# Patient Record
Sex: Female | Born: 1960 | Race: Black or African American | Hispanic: No | Marital: Single | Smoking: Never smoker
Health system: Southern US, Community
[De-identification: ages and names within clinical notes are randomized; demographics above are authoritative.]

## PROBLEM LIST (undated history)

## (undated) DIAGNOSIS — I10 Essential (primary) hypertension: Secondary | ICD-10-CM

## (undated) HISTORY — PX: TUBAL LIGATION: SHX77

## (undated) HISTORY — DX: Essential (primary) hypertension: I10

## (undated) HISTORY — PX: CHOLECYSTECTOMY: SHX55

---

## 1998-02-27 ENCOUNTER — Other Ambulatory Visit: Admission: RE | Admit: 1998-02-27 | Discharge: 1998-02-27 | Payer: Self-pay | Admitting: *Deleted

## 1999-08-05 ENCOUNTER — Other Ambulatory Visit: Admission: RE | Admit: 1999-08-05 | Discharge: 1999-08-05 | Payer: Self-pay | Admitting: *Deleted

## 2007-09-23 HISTORY — PX: CHOLECYSTECTOMY: SHX55

## 2012-07-20 ENCOUNTER — Ambulatory Visit (INDEPENDENT_AMBULATORY_CARE_PROVIDER_SITE_OTHER): Payer: BC Managed Care – PPO | Admitting: Family Medicine

## 2012-07-20 ENCOUNTER — Encounter: Payer: Self-pay | Admitting: Family Medicine

## 2012-07-20 ENCOUNTER — Ambulatory Visit: Payer: BC Managed Care – PPO

## 2012-07-20 VITALS — BP 202/104 | HR 76 | Temp 98.3°F | Resp 20 | Ht 66.0 in | Wt 241.0 lb

## 2012-07-20 DIAGNOSIS — R0789 Other chest pain: Secondary | ICD-10-CM

## 2012-07-20 DIAGNOSIS — I10 Essential (primary) hypertension: Secondary | ICD-10-CM

## 2012-07-20 DIAGNOSIS — D649 Anemia, unspecified: Secondary | ICD-10-CM

## 2012-07-20 DIAGNOSIS — R51 Headache: Secondary | ICD-10-CM

## 2012-07-20 LAB — POCT CBC
Granulocyte percent: 74.1 %G (ref 37–80)
HCT, POC: 36.7 % — AB (ref 37.7–47.9)
Hemoglobin: 10.5 g/dL — AB (ref 12.2–16.2)
Lymph, poc: 2.2 (ref 0.6–3.4)
MCH, POC: 20 pg — AB (ref 27–31.2)
MCHC: 28.6 g/dL — AB (ref 31.8–35.4)
MCV: 69.7 fL — AB (ref 80–97)
MID (cbc): 0.5 (ref 0–0.9)
MPV: 9.8 fL (ref 0–99.8)
POC Granulocyte: 7.6 — AB (ref 2–6.9)
POC LYMPH PERCENT: 21.2 %L (ref 10–50)
POC MID %: 4.7 %M (ref 0–12)
Platelet Count, POC: 432 10*3/uL — AB (ref 142–424)
RBC: 5.26 M/uL (ref 4.04–5.48)
RDW, POC: 18.8 %
WBC: 10.2 10*3/uL (ref 4.6–10.2)

## 2012-07-20 LAB — POCT URINALYSIS DIPSTICK
Bilirubin, UA: NEGATIVE
Glucose, UA: NEGATIVE
Ketones, UA: NEGATIVE
Leukocytes, UA: NEGATIVE
Nitrite, UA: NEGATIVE
Protein, UA: NEGATIVE
Spec Grav, UA: <=1.005
Urobilinogen, UA: 0.2
pH, UA: 7

## 2012-07-20 MED ORDER — LISINOPRIL-HYDROCHLOROTHIAZIDE 10-12.5 MG PO TABS: 1.0000 | ORAL_TABLET | Freq: Every day | ORAL | Status: DC

## 2012-07-20 NOTE — Progress Notes (Signed)
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   Beginning yesterday morning she developed some head fullness with blurry vision.  LMP has been irregular last two months, started last Friday and is ending.  She has a gynecologist.  G2P2.    No chest pain, leg cramps,   Objective:  NAD HEENT:  Normal including fundi Neck:  No thyroidmegaly or adenop, supple Chest:  Clear Heart:  Regular, no murmur or gallop Extrem:  No edema  [facility] reading (PRIMARY) by  Dr. Milus Glazier: CXR:  NAD EKG: nsr . Results for orders placed in visit on 07/20/12  POCT CBC      Component Value Range   WBC 10.2  4.6 - 10.2 K/uL   Lymph, poc 2.2  0.6 - 3.4   POC LYMPH PERCENT 21.2  10 - 50 %L   MID (cbc) 0.5  0 - 0.9   POC MID % 4.7  0 - 12 %M   POC Granulocyte 7.6 (*) 2 - 6.9   Granulocyte percent 74.1  37 - 80 %G   RBC 5.26  4.04 - 5.48 M/uL   Hemoglobin 10.5 (*) 12.2 - 16.2 g/dL   HCT, POC 16.1 (*) 09.6 - 47.9 %   MCV 69.7 (*) 80 - 97 fL   MCH, POC 20.0 (*) 27 - 31.2 pg   MCHC 28.6 (*) 31.8 - 35.4 g/dL   RDW, POC 04.5     Platelet Count, POC 432 (*) 142 - 424 K/uL   MPV 9.8  0 - 99.8 fL  POCT URINALYSIS DIPSTICK      Component Value Range   Color, UA yellow     Clarity, UA clear     Glucose, UA neg     Bilirubin, UA neg     Ketones, UA neg     Spec Grav, UA <=1.005     Blood, UA trace     pH, UA 7.0     Protein, UA neg     Urobilinogen, UA 0.2     Nitrite, UA neg     Leukocytes, UA Negative      Assessment: 51 year old woman with new-onset hypertension despite a 10 pound weight loss over the last several months. She demonstrates good understanding of hypertension and is motivated to lose more weight in order to correct this elevation of blood pressure.  Patient also has a anemia which is probably iron deficiency secondary to irregular vaginal bleeding. I've asked her to follow up with her gynecologist and start iron.  Plan: Followup one month.   patient to start ferrous sulfate treatment and  followup at as above 1. Hypertension  Comprehensive metabolic panel, EKG 12-Lead, DG Chest 2 View, POCT CBC, Lipid panel, POCT urinalysis dipstick, EKG 12-Lead, T4, Free, TSH, lisinopril-hydrochlorothiazide (PRINZIDE,ZESTORETIC) 10-12.5 MG per tablet  2. Headache    3. Anemia  Ferritin

## 2012-07-20 NOTE — Patient Instructions (Signed)
Iron Deficiency Anemia There are many types of anemia. Iron deficiency anemia is the most common. Iron deficiency anemia is a decrease in the number of red blood cells caused by too little iron. Without enough iron, your body does not produce enough hemoglobin. Hemoglobin is a substance in red blood cells that carries oxygen to the body's tissues. Iron deficiency anemia may leave you tired and short of breath. CAUSES   Lack of iron in the diet.  This may be seen in infants and children, because there is little iron in milk.  This may be seen in adults who do not eat enough iron-rich foods.  This may be seen in pregnant or breastfeeding women who do not take iron supplements. There is a much higher need for iron intake at these times.  Poor absorption of iron, as seen with intestinal disorders.  Intestinal bleeding.  Heavy periods. SYMPTOMS  Mild anemia may not be noticeable. Symptoms may include:  Fatigue.  Headache.  Pale skin.  Weakness.  Shortness of breath.  Dizziness.  Cold hands and feet.  Fast or irregular heartbeat. DIAGNOSIS  Diagnosis requires a thorough evaluation and physical exam by your caregiver.  Blood tests are generally used to confirm iron deficiency anemia.  Additional tests may be done to find the underlying cause of your anemia. These may include:  Testing for blood in the stool (fecal occult blood test).  A procedure to see inside the colon and rectum (colonoscopy).  A procedure to see inside the esophagus and stomach (endoscopy). TREATMENT   Correcting the cause of the iron deficiency is the first step.  Medicines, such as oral contraceptives, can make heavy menstrual flows lighter.  Antibiotics and other medicines can be used to treat peptic ulcers.  Surgery may be needed to remove a bleeding polyp, tumor, or fibroid.  Often, iron supplements (ferrous sulfate) are taken.  For the best iron absorption, take these supplements with an  empty stomach.  You may need to take the supplements with food if you cannot tolerate them on an empty stomach. Vitamin C improves the absorption of iron. Your caregiver may recommend taking your iron tablets with a glass of orange juice or vitamin C supplement.  Milk and antacids should not be taken at the same time as iron supplements. They may interfere with the absorption of iron.  Iron supplements can cause constipation. A stool softener is often recommended.  Pregnant and breastfeeding women will need to take extra iron, because their normal diet usually will not provide the required amount.  Patients who cannot tolerate iron by mouth can take it through a vein (intravenously) or by an injection into the muscle. HOME CARE INSTRUCTIONS   Ask your dietitian for help with diet questions.  Take iron and vitamins as directed by your caregiver.  Eat a diet rich in iron. Eat liver, lean beef, whole-grain bread, eggs, dried fruit, and dark green leafy vegetables. SEEK IMMEDIATE MEDICAL CARE IF:   You have a fainting episode. Do not drive yourself. Call your local emergency services (911 in [place] if no other help is available.  You have chest pain, nausea, or vomiting.  You develop severe or increased shortness of breath with activities.  You develop weakness or increased thirst.  You have a rapid heartbeat.  You develop unexplained sweating or become lightheaded when getting up from a chair or bed. MAKE SURE YOU:   Understand these instructions.  Will watch your condition.  Will get help right away   if you are not doing well or get worse. Document Released: 09/05/2000 Document Revised: 12/01/2011 Document Reviewed: 01/15/2010 [facility] Patient Information 2013 [place], [place]. Anemia, Frequently Asked Questions WHAT ARE THE SYMPTOMS OF ANEMIA?  Headache.  Difficulty thinking.  Fatigue.  Shortness of breath.  Weakness.  Rapid heartbeat. AT WHAT POINT ARE PEOPLE  CONSIDERED ANEMIC?  This varies with gender and age.   Both hemoglobin (Hgb) and hematocrit values are used to define anemia. These lab values are obtained from a complete blood count (CBC) test. This is performed at a caregiver's office.  The normal range of hemoglobin values for adult men is 14.0 g/dL to 16.1 g/dL. For nonpregnant women, values are 12.3 g/dL to 09.6 g/dL.  The World Health Organization defines anemia as less than 12 g/dL for nonpregnant women and less than 13 g/dL for men.  For adult males, the average normal hematocrit is 46%, and the range is 40% to 52%.  For adult females, the average normal hematocrit is 41%, and the range is 35% to 47%.  Values that fall below the lower limits can be a sign of anemia and should have further checking (evaluation). GROUPS OF PEOPLE WHO ARE AT RISK FOR DEVELOPING ANEMIA INCLUDE:   Infants who are breastfed or taking a formula that is not fortified with iron.  Children going through a rapid growth spurt. The iron available can not keep up with the needs for a red cell mass which must grow with the child.  Women in childbearing years. They need iron because of blood loss during menstruation.  Pregnant women. The growing fetus creates a high demand for iron.  People with ongoing gastrointestinal blood loss are at risk of developing iron deficiency.  Individuals with leukemia or cancer who must receive chemotherapy or radiation to treat their disease. The drugs or radiation used to treat these diseases often decreases the bone marrow's ability to make cells of all classes. This includes red blood cells, white blood cells, and platelets.  Individuals with chronic inflammatory conditions such as rheumatoid arthritis or chronic infections.  The elderly. ARE SOME TYPES OF ANEMIA INHERITED?   Yes, some types of anemia are due to inherited or genetic defects.  Sickle cell anemia. This occurs most often in people of African, African  American, and Mediterranean descent.  Thalassemia (or Cooley's anemia). This type is found in people of Mediterranean and Southeast Asian descent. These types of anemia are common.  Fanconi. This is rare. CAN CERTAIN MEDICATIONS CAUSE A PERSON TO BECOME ANEMIC?  Yes. For example, drugs to fight cancer (chemotherapeutic agents) often cause anemia. These drugs can slow the bone marrow's ability to make red blood cells. If there are not enough red blood cells, the body does not get enough oxygen. WHAT HEMATOCRIT LEVEL IS REQUIRED TO DONATE BLOOD?  The lower limit of an acceptable hematocrit for blood donors is 38%. If you have a low hematocrit value, you should schedule an appointment with your caregiver. ARE BLOOD TRANSFUSIONS COMMONLY USED TO CORRECT ANEMIA, AND ARE THEY DANGEROUS?  They are used to treat anemia as a last resort. Your caregiver will find the cause of the anemia and correct it if possible. Most blood transfusions are given because of excessive bleeding at the time of surgery, with trauma, or because of bone marrow suppression in patients with cancer or leukemia on chemotherapy. Blood transfusions are safer than ever before. We also know that blood transfusions affect the immune system and may increase certain risks. There  is also a concern for human error. In 1/16,000 transfusions, a patient receives a transfusion of blood that is not matched with his or her blood type.  WHAT IS IRON DEFICIENCY ANEMIA AND CAN I CORRECT IT BY CHANGING MY DIET?  Iron is an essential part of hemoglobin. Without enough hemoglobin, anemia develops and the body does not get the right amount of oxygen. Iron deficiency anemia develops after the body has had a low level of iron for a long time. This is either caused by blood loss, not taking in or absorbing enough iron, or increased demands for iron (like pregnancy or rapid growth).  Foods from animal origin such as beef, chicken, and pork, are good sources of  iron. Be sure to have one of these foods at each meal. Vitamin C helps your body absorb iron. Foods rich in Vitamin C include citrus, bell pepper, strawberries, spinach and cantaloupe. In some cases, iron supplements may be needed in order to correct the iron deficiency. In the case of poor absorption, extra iron may have to be given directly into the vein through a needle (intravenously). I HAVE BEEN DIAGNOSED WITH IRON DEFICIENCY ANEMIA AND MY CAREGIVER PRESCRIBED IRON SUPPLEMENTS. HOW LONG WILL IT TAKE FOR MY BLOOD TO BECOME NORMAL?  It depends on the degree of anemia at the beginning of treatment. Most people with mild to moderate iron deficiency, anemia will correct the anemia over a period of 2 to 3 months. But after the anemia is corrected, the iron stored by the body is still low. Caregivers often suggest an additional 6 months of oral iron therapy once the anemia has been reversed. This will help prevent the iron deficiency anemia from quickly happening again. Non-anemic adult males should take iron supplements only under the direction of a doctor, too much iron can cause liver damage.  MY HEMOGLOBIN IS 9 G/DL AND I AM SCHEDULED FOR SURGERY. SHOULD I POSTPONE THE SURGERY?  If you have Hgb of 9, you should discuss this with your caregiver right away. Many patients with similar hemoglobin levels have had surgery without problems. If minimal blood loss is expected for a minor procedure, no treatment may be necessary.  If a greater blood loss is expected for more extensive procedures, you should ask your caregiver about being treated with erythropoietin and iron. This is to accelerate the recovery of your hemoglobin to a normal level before surgery. An anemic patient who undergoes high-blood-loss surgery has a greater risk of surgical complications and need for a blood transfusion, which also carries some risk.  I HAVE BEEN TOLD THAT HEAVY MENSTRUAL PERIODS CAUSE ANEMIA. IS THERE ANYTHING I CAN DO TO  PREVENT THE ANEMIA?  Anemia that results from heavy periods is usually due to iron deficiency. You can try to meet the increased demands for iron caused by the heavy monthly blood loss by increasing the intake of iron-rich foods. Iron supplements may be required. Discuss your concerns with your caregiver. WHAT CAUSES ANEMIA DURING PREGNANCY?  Pregnancy places major demands on the body. The mother must meet the needs of both her body and her growing baby. The body needs enough iron and folate to make the right amount of red blood cells. To prevent anemia while pregnant, the mother should stay in close contact with her caregiver.  Be sure to eat a diet that has foods rich in iron and folate like liver and dark green leafy vegetables. Folate plays an important role in the normal development of a  baby's spinal cord. Folate can help prevent serious disorders like spina bifida. If your diet does not provide adequate nutrients, you may want to talk with your caregiver about nutritional supplements.  WHAT IS THE RELATIONSHIP BETWEEN FIBROID TUMORS AND ANEMIA IN WOMEN?  The relationship is usually caused by the increased menstrual blood loss caused by fibroids. Good iron intake may be required to prevent iron deficiency anemia from developing.  Document Released: 04/16/2004 Document Revised: 12/01/2011 Document Reviewed: 10/01/2010 [facility] Patient Information 2013 [place], [place]. Hypertension As your heart beats, it forces blood through your arteries. This force is your blood pressure. If the pressure is too high, it is called hypertension (HTN) or high blood pressure. HTN is dangerous because you may have it and not know it. High blood pressure may mean that your heart has to work harder to pump blood. Your arteries may be narrow or stiff. The extra work puts you at risk for heart disease, stroke, and other problems.  Blood pressure consists of two numbers, a higher number over a lower, 110/72, for example. It  is stated as "110 over 72." The ideal is below 120 for the top number (systolic) and under 80 for the bottom (diastolic). Write down your blood pressure today. You should pay close attention to your blood pressure if you have certain conditions such as:  Heart failure.  Prior heart attack.  Diabetes  Chronic kidney disease.  Prior stroke.  Multiple risk factors for heart disease. To see if you have HTN, your blood pressure should be measured while you are seated with your arm held at the level of the heart. It should be measured at least twice. A one-time elevated blood pressure reading (especially in the Emergency Department) does not mean that you need treatment. There may be conditions in which the blood pressure is different between your right and left arms. It is important to see your caregiver soon for a recheck. Most people have essential hypertension which means that there is not a specific cause. This type of high blood pressure may be lowered by changing lifestyle factors such as:  Stress.  Smoking.  Lack of exercise.  Excessive weight.  Drug/tobacco/alcohol use.  Eating less salt. Most people do not have symptoms from high blood pressure until it has caused damage to the body. Effective treatment can often prevent, delay or reduce that damage. TREATMENT  When a cause has been identified, treatment for high blood pressure is directed at the cause. There are a large number of medications to treat HTN. These fall into several categories, and your caregiver will help you select the medicines that are best for you. Medications may have side effects. You should review side effects with your caregiver. If your blood pressure stays high after you have made lifestyle changes or started on medicines,   Your medication(s) may need to be changed.  Other problems may need to be addressed.  Be certain you understand your prescriptions, and know how and when to take your  medicine.  Be sure to follow up with your caregiver within the time frame advised (usually within two weeks) to have your blood pressure rechecked and to review your medications.  If you are taking more than one medicine to lower your blood pressure, make sure you know how and at what times they should be taken. Taking two medicines at the same time can result in blood pressure that is too low. SEEK IMMEDIATE MEDICAL CARE IF:  You develop a severe headache,  blurred or changing vision, or confusion.  You have unusual weakness or numbness, or a faint feeling.  You have severe chest or abdominal pain, vomiting, or breathing problems. MAKE SURE YOU:   Understand these instructions.  Will watch your condition.  Will get help right away if you are not doing well or get worse. Document Released: 09/08/2005 Document Revised: 12/01/2011 Document Reviewed: 04/28/2008 [facility] Patient Information 2013 [place], [place].

## 2012-07-21 LAB — T4, FREE: Free T4: 1.2 ng/dL (ref 0.80–1.80)

## 2012-07-21 LAB — LIPID PANEL
Cholesterol: 171 mg/dL (ref 0–200)
HDL: 49 mg/dL (ref >39)
LDL Cholesterol: 109 mg/dL — ABNORMAL HIGH (ref 0–99)
Total CHOL/HDL Ratio: 3.5 Ratio
Triglycerides: 64 mg/dL (ref <150)
VLDL: 13 mg/dL (ref 0–40)

## 2012-07-21 LAB — COMPREHENSIVE METABOLIC PANEL
ALT: 11 U/L (ref 0–35)
AST: 17 U/L (ref 0–37)
Albumin: 4.1 g/dL (ref 3.5–5.2)
Alkaline Phosphatase: 84 U/L (ref 39–117)
BUN: 8 mg/dL (ref 6–23)
CO2: 27 mEq/L (ref 19–32)
Calcium: 9.4 mg/dL (ref 8.4–10.5)
Chloride: 102 mEq/L (ref 96–112)
Creat: 0.75 mg/dL (ref 0.50–1.10)
Glucose, Bld: 78 mg/dL (ref 70–99)
Potassium: 4.2 mEq/L (ref 3.5–5.3)
Sodium: 137 mEq/L (ref 135–145)
Total Bilirubin: 0.9 mg/dL (ref 0.3–1.2)
Total Protein: 7.4 g/dL (ref 6.0–8.3)

## 2012-07-21 LAB — TSH: TSH: 1.409 u[IU]/mL (ref 0.350–4.500)

## 2012-07-21 LAB — FERRITIN: Ferritin: 8 ng/mL — ABNORMAL LOW (ref 10–291)

## 2012-07-22 ENCOUNTER — Telehealth: Payer: Self-pay

## 2012-07-22 NOTE — Telephone Encounter (Signed)
Pt is calling returning our call regarding labs

## 2012-07-22 NOTE — Telephone Encounter (Signed)
Patient has been advised of lab results.  °

## 2013-07-20 ENCOUNTER — Encounter: Payer: Self-pay | Admitting: Family Medicine

## 2013-07-20 ENCOUNTER — Ambulatory Visit (INDEPENDENT_AMBULATORY_CARE_PROVIDER_SITE_OTHER): Payer: BC Managed Care – PPO | Admitting: Family Medicine

## 2013-07-20 VITALS — BP 178/94 | HR 80 | Temp 98.4°F | Resp 16 | Ht 65.25 in | Wt 216.8 lb

## 2013-07-20 DIAGNOSIS — Z0000 Encounter for general adult medical examination without abnormal findings: Secondary | ICD-10-CM

## 2013-07-20 DIAGNOSIS — N951 Menopausal and female climacteric states: Secondary | ICD-10-CM

## 2013-07-20 DIAGNOSIS — E041 Nontoxic single thyroid nodule: Secondary | ICD-10-CM

## 2013-07-20 DIAGNOSIS — Z23 Encounter for immunization: Secondary | ICD-10-CM

## 2013-07-20 DIAGNOSIS — D649 Anemia, unspecified: Secondary | ICD-10-CM

## 2013-07-20 DIAGNOSIS — I10 Essential (primary) hypertension: Secondary | ICD-10-CM

## 2013-07-20 LAB — COMPREHENSIVE METABOLIC PANEL
ALT: 10 U/L (ref 0–35)
AST: 15 U/L (ref 0–37)
Albumin: 4.4 g/dL (ref 3.5–5.2)
Alkaline Phosphatase: 63 U/L (ref 39–117)
BUN: 10 mg/dL (ref 6–23)
CO2: 27 mEq/L (ref 19–32)
Calcium: 9.6 mg/dL (ref 8.4–10.5)
Chloride: 101 mEq/L (ref 96–112)
Creat: 0.63 mg/dL (ref 0.50–1.10)
Glucose, Bld: 81 mg/dL (ref 70–99)
Potassium: 4.3 mEq/L (ref 3.5–5.3)
Sodium: 138 mEq/L (ref 135–145)
Total Bilirubin: 1 mg/dL (ref 0.3–1.2)
Total Protein: 7.5 g/dL (ref 6.0–8.3)

## 2013-07-20 LAB — CBC
HCT: 37.5 % (ref 36.0–46.0)
Hemoglobin: 11.8 g/dL — ABNORMAL LOW (ref 12.0–15.0)
MCH: 24 pg — ABNORMAL LOW (ref 26.0–34.0)
MCHC: 31.5 g/dL (ref 30.0–36.0)
MCV: 76.4 fL — ABNORMAL LOW (ref 78.0–100.0)
Platelets: 348 10*3/uL (ref 150–400)
RBC: 4.91 MIL/uL (ref 3.87–5.11)
RDW: 15.2 % (ref 11.5–15.5)
WBC: 8.8 10*3/uL (ref 4.0–10.5)

## 2013-07-20 LAB — TSH: TSH: 1.552 u[IU]/mL (ref 0.350–4.500)

## 2013-07-20 MED ORDER — LISINOPRIL-HYDROCHLOROTHIAZIDE 10-12.5 MG PO TABS: 1.0000 | ORAL_TABLET | Freq: Every day | ORAL | Status: DC

## 2013-07-20 NOTE — Progress Notes (Signed)
Patient ID: Toshua Honsinger MRN: [id number], DOB: 1960/10/06, 52 y.o. Date of Encounter: 07/20/2013, 1:28 PM  Primary Physician: No primary provider on file.  Chief Complaint: HTN  HPI: 52 y.o. year old female with history below presents for hypertension follow up. 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   No past medical history on file.   Home Meds: Prior to Admission medications   Medication Sig Start Date End Date Taking? Authorizing Provider  lisinopril-hydrochlorothiazide (PRINZIDE,ZESTORETIC) 10-12.5 MG per tablet Take 1 tablet by mouth daily. 07/20/12  Yes Elvina Sidle, MD    Allergies: No Known Allergies  History   Social History  . Marital Status: Single    Spouse Name: N/A    Number of Children: N/A  . Years of Education: N/A   Occupational History  . Not on file.   Social History Main Topics  . Smoking status: Never Smoker   . Smokeless tobacco: Not on file  . Alcohol Use: Not on file  . Drug Use: Not on file  . Sexual Activity: Not on file   Other Topics Concern  . Not on file   Social History Narrative  . No narrative on file     Family History  Problem Relation Age of Onset  . Hyperlipidemia Mother   . Cancer Father     Review of Systems: Constitutional: negative for chills, fever, night sweats, weight changes, or fatigue  HEENT: negative for vision changes, hearing loss, congestion, rhinorrhea, ST, epistaxis, or sinus pressure Cardiovascular: negative for chest pain, palpitations, or DOE Respiratory: negative for hemoptysis, wheezing, shortness of breath, or cough Abdominal: negative for abdominal pain, nausea, vomiting, diarrhea, or constipation Dermatological: negative for rash Neurologic: negative for headache, dizziness, or syncope All other systems reviewed and are otherwise negative with the exception to those above and in the  HPI.   Physical Exam:  BP recheck 138/76 Blood pressure 178/94, pulse 80, temperature 98.4 F (36.9 C), temperature source Oral, resp. rate 16, height 5' 5.25" (1.657 m), weight 216 lb 12.8 oz (98.34 kg), last menstrual period 07/06/2013, SpO2 100.00%., Body mass index is 35.82 kg/(m^2). General: Well developed, well nourished, in no acute distress. Head: Normocephalic, atraumatic, eyes without discharge, sclera non-icteric, nares are without discharge. Bilateral auditory canals clear, TM's are without perforation, pearly grey and translucent with reflective cone of light bilaterally. Oral cavity moist, posterior pharynx without exudate, erythema, peritonsillar abscess, or post nasal drip.  Neck: Supple. Left thyroid nodule Full ROM. No lymphadenopathy. No carotid bruits. Lungs: Clear bilaterally to auscultation without wheezes, rales, or rhonchi. Breathing is unlabored. Heart: RRR with S1 S2. No murmurs, rubs, or gallops appreciated.  Msk:  Strength and tone normal for age. Extremities/Skin: Warm and dry. No clubbing or cyanosis. No edema. No rashes or suspicious lesions. Distal pulses 2+ and equal bilaterally. Neuro: Alert and oriented X 3. Moves all extremities spontaneously. Gait is normal. CNII-XII grossly in tact. DTR 2+, cerebellar function intact. Rhomberg normal. Psych:  Responds to questions appropriately with a normal affect.   Labs:  CMP pending  ASSESSMENT AND PLAN:  52 y.o. year old female with Hypertension - Plan: TSH, Comprehensive metabolic panel, CBC  Anemia - Plan: TSH, Comprehensive metabolic panel, CBC  Perimenopausal - Plan: CBC  Thyroid nodule - Plan: TSH, US Soft Tissue Head/Neck  Annual physical exam - Plan: Ambulatory referral to Gastroenterology   -  Signed, Kenyon Ana  Chinwe Lope, MD 07/20/2013 1:28 PM

## 2013-07-30 ENCOUNTER — Other Ambulatory Visit: Payer: Self-pay | Admitting: Family Medicine

## 2013-08-10 ENCOUNTER — Ambulatory Visit
Admission: RE | Admit: 2013-08-10 | Discharge: 2013-08-10 | Disposition: A | Discharge status: Home / Self Care | Payer: BC Managed Care – PPO | Source: Ambulatory Visit | Attending: Family Medicine | Admitting: Family Medicine

## 2013-08-10 DIAGNOSIS — E041 Nontoxic single thyroid nodule: Secondary | ICD-10-CM

## 2013-08-12 ENCOUNTER — Other Ambulatory Visit: Payer: Self-pay | Admitting: Family Medicine

## 2013-08-12 DIAGNOSIS — E041 Nontoxic single thyroid nodule: Secondary | ICD-10-CM

## 2013-08-15 ENCOUNTER — Telehealth: Payer: Self-pay

## 2013-08-15 ENCOUNTER — Other Ambulatory Visit: Payer: Self-pay | Admitting: Radiology

## 2013-08-15 DIAGNOSIS — E041 Nontoxic single thyroid nodule: Secondary | ICD-10-CM

## 2013-08-15 NOTE — Telephone Encounter (Signed)
Dr. Elbert Ewings   Patient is being asked to come back for second imaging and wants your blessing.   [phone]

## 2013-08-15 NOTE — Telephone Encounter (Signed)
Did you advise patient? I was asked to put in biopsy order, What do you want me to advise her?

## 2013-08-16 ENCOUNTER — Telehealth: Payer: Self-pay | Admitting: Radiology

## 2013-08-16 NOTE — Telephone Encounter (Signed)
Phone call to patient/ apologized for the miscommunication. She did not get a call about the biopsy, it was ordered for her. Explained, and she understands, to you [facility]

## 2013-08-16 NOTE — Telephone Encounter (Signed)
Order put in, is patient aware?

## 2013-08-16 NOTE — Telephone Encounter (Signed)
Yes, please put in additional imaging request

## 2013-08-16 NOTE — Telephone Encounter (Signed)
Patient was not aware. I have called her and advised of need for biopsy.

## 2013-08-24 ENCOUNTER — Ambulatory Visit
Admission: RE | Admit: 2013-08-24 | Discharge: 2013-08-24 | Disposition: A | Discharge status: Home / Self Care | Payer: BC Managed Care – PPO | Source: Ambulatory Visit | Attending: Family Medicine | Admitting: Family Medicine

## 2013-08-24 ENCOUNTER — Other Ambulatory Visit (HOSPITAL_COMMUNITY)
Admission: RE | Admit: 2013-08-24 | Discharge: 2013-08-24 | Disposition: A | Discharge status: Home / Self Care | Payer: BC Managed Care – PPO | Source: Ambulatory Visit | Attending: Interventional Radiology | Admitting: Interventional Radiology

## 2013-08-24 DIAGNOSIS — E041 Nontoxic single thyroid nodule: Secondary | ICD-10-CM

## 2013-09-07 ENCOUNTER — Encounter: Payer: Self-pay | Admitting: Family Medicine

## 2013-12-14 ENCOUNTER — Other Ambulatory Visit: Payer: Self-pay | Admitting: Obstetrics and Gynecology

## 2014-01-16 IMAGING — US US THYROID BIOPSY
1 series · 11 of 11 positions shown · non-contrast
Comparison: Thyroid ultrasound dated 08/10/2013

COMPLICATIONS:
None

CLINICAL DATA: Dominant 3.5 cm left thyroid nodule.

EXAM:
ULTRASOUND GUIDED NEEDLE ASPIRATE BIOPSY OF THE THYROID GLAND

[Series 1: us thyroid biopsy · 0.07mm/px · 11 acquisitions, 11 frames shown]
[im 1/11]
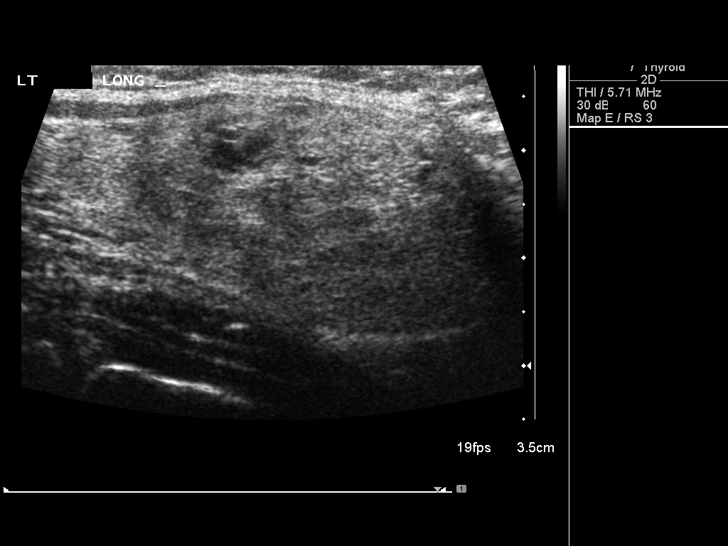
[im 2/11]
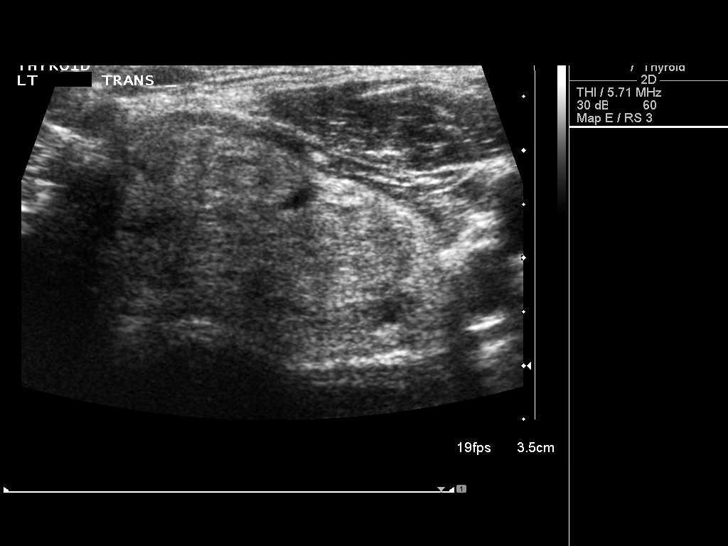
[im 3/11]
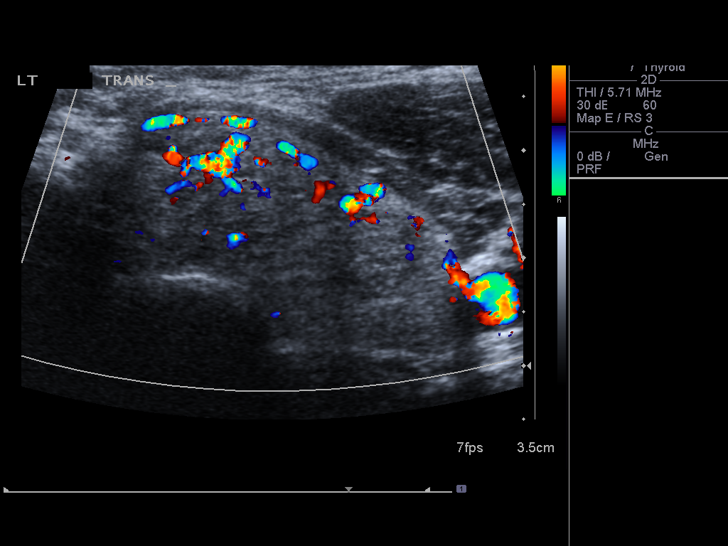
[im 4/11]
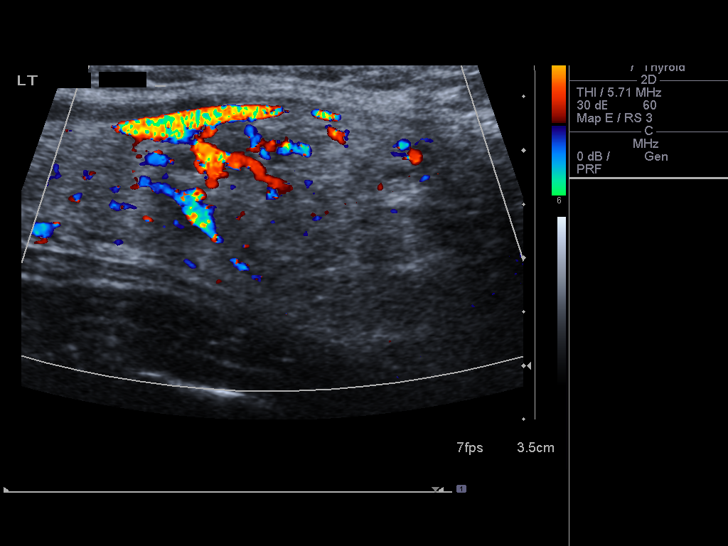
[im 5/11]
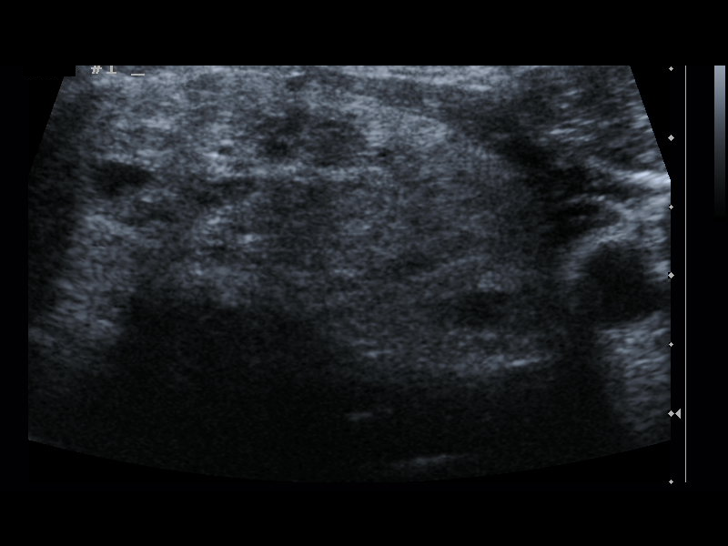
[im 6/11]
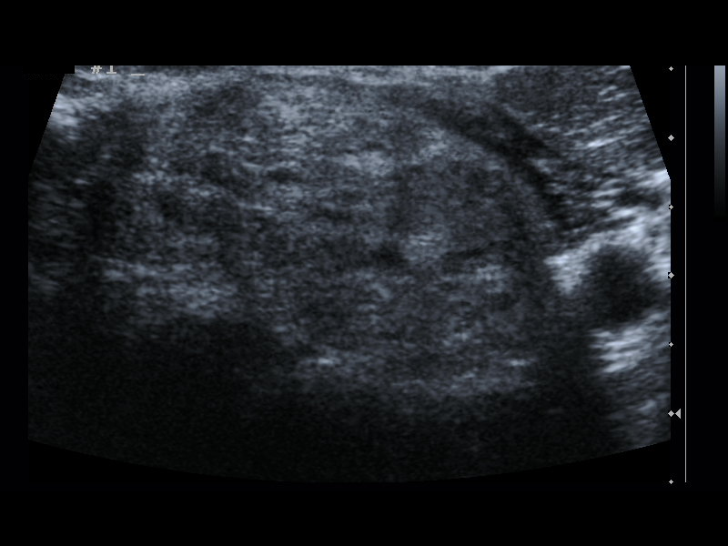
[im 7/11]
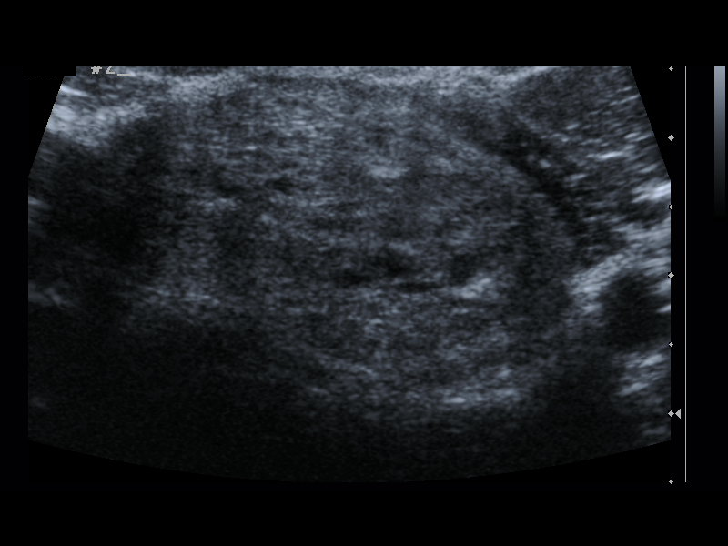
[im 8/11]
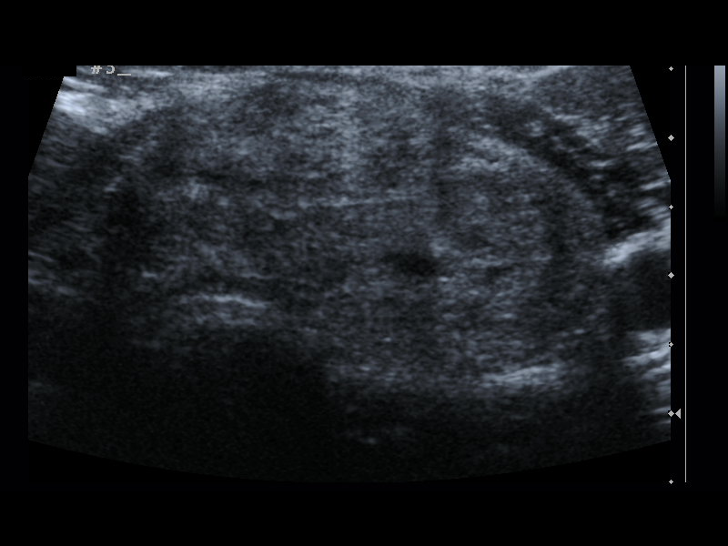
[im 9/11]
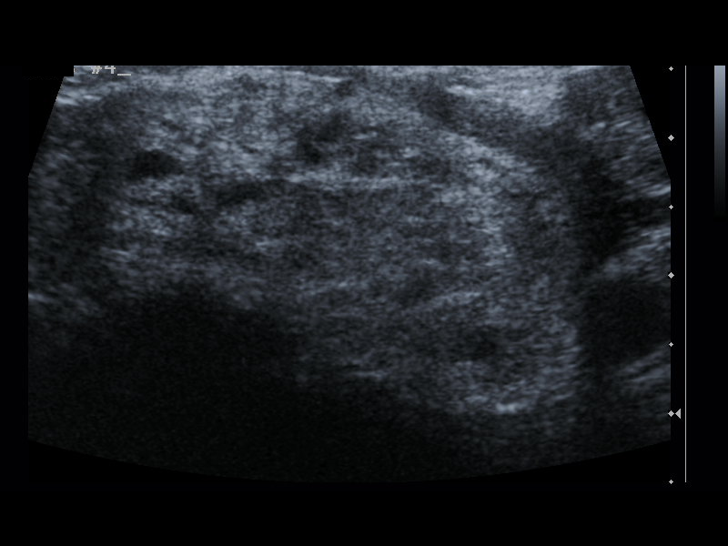
[im 10/11]
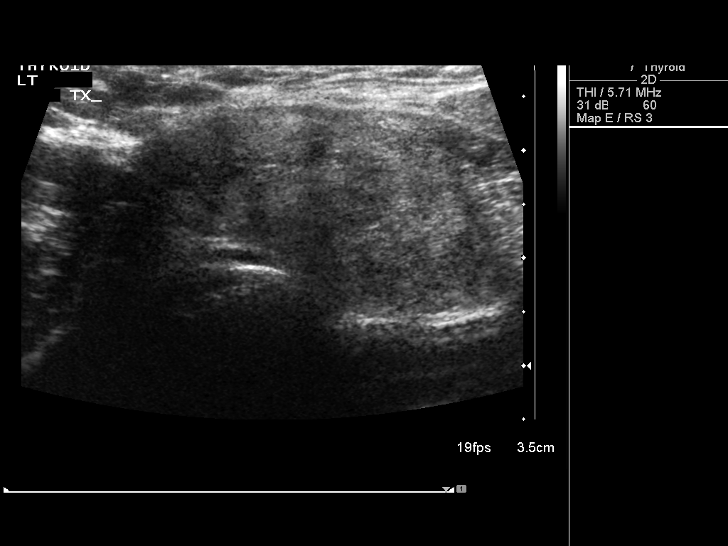
[im 11/11]
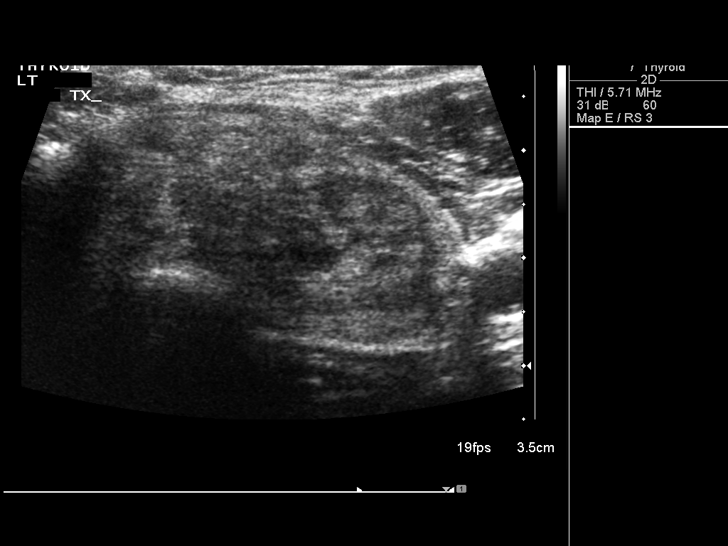

[11 of 11 positions shown; findings below may reference images not displayed]

FINDINGS: Needle aspirate samples were obtained in different portions of the
dominant left thyroid nodule.
IMPRESSION: Ultrasound guided needle aspirate biopsy performed of the dominant
left thyroid nodule.

PROCEDURE:
Thyroid biopsy was thoroughly discussed with the patient and
questions were answered. The benefits, risks, alternatives, and
complications were also discussed. The patient understands and
wishes to proceed with the procedure. Written consent was obtained.

Ultrasound was performed to localize and mark an adequate site for
the biopsy. The patient was then prepped and draped in a normal
sterile fashion. Local anesthesia was provided with 1% lidocaine.
Using direct ultrasound guidance, 4 passes were made using 25 gauge
needles into the nodule within the left lobe of the thyroid.
Ultrasound was used to confirm needle placements on all occasions.
Specimens were sent to Pathology for analysis.

## 2014-08-07 ENCOUNTER — Ambulatory Visit (INDEPENDENT_AMBULATORY_CARE_PROVIDER_SITE_OTHER): Payer: BC Managed Care – PPO | Admitting: Family Medicine

## 2014-08-07 ENCOUNTER — Encounter: Payer: Self-pay | Admitting: Family Medicine

## 2014-08-07 VITALS — BP 150/80 | HR 67 | Temp 98.3°F | Resp 16 | Ht 66.0 in | Wt 227.0 lb

## 2014-08-07 DIAGNOSIS — I10 Essential (primary) hypertension: Secondary | ICD-10-CM

## 2014-08-07 DIAGNOSIS — Z1211 Encounter for screening for malignant neoplasm of colon: Secondary | ICD-10-CM

## 2014-08-07 LAB — COMPREHENSIVE METABOLIC PANEL
ALT: 14 U/L (ref 0–35)
AST: 18 U/L (ref 0–37)
Albumin: 4.2 g/dL (ref 3.5–5.2)
Alkaline Phosphatase: 76 U/L (ref 39–117)
BUN: 10 mg/dL (ref 6–23)
CO2: 30 mEq/L (ref 19–32)
Calcium: 9.4 mg/dL (ref 8.4–10.5)
Chloride: 99 mEq/L (ref 96–112)
Creat: 0.7 mg/dL (ref 0.50–1.10)
Glucose, Bld: 80 mg/dL (ref 70–99)
Potassium: 3.9 mEq/L (ref 3.5–5.3)
Sodium: 140 mEq/L (ref 135–145)
Total Bilirubin: 0.8 mg/dL (ref 0.2–1.2)
Total Protein: 7.8 g/dL (ref 6.0–8.3)

## 2014-08-07 MED ORDER — LISINOPRIL-HYDROCHLOROTHIAZIDE 10-12.5 MG PO TABS: 1.0000 | ORAL_TABLET | Freq: Every day | ORAL | Status: DC

## 2014-08-07 NOTE — Progress Notes (Signed)
° °  Subjective:    Patient ID: Kathy Barron, female    DOB: 11/30/1960, 53 y.o.   MRN: [id number] This chart was scribed for Robyn Haber, MD by Marti Sleigh, Medical Scribe. This patient was seen in Room 27 and the patient's care was started a 10:46 AM.   HPI HPI Comments: Kathy Barron is a 53 y.o. female who presents to [facility] reporting for a medication refill. Pt states she needs a referral for colonoscopy. Pt states she needs a refill of her HCTZ. Pt states she will return for a flu shot once her rhinorrhea resolves.    Review of Systems  Constitutional: Negative for fever and chills.  HENT: Positive for rhinorrhea.   Respiratory: Negative for cough.   Musculoskeletal: Negative for myalgias.  Neurological: Negative for headaches.       Objective:   Physical Exam  Constitutional: She is oriented to person, place, and time. She appears well-developed and well-nourished.  HENT:  Head: Normocephalic and atraumatic.  Eyes: EOM are normal. Pupils are equal, round, and reactive to light. Right eye exhibits no discharge. Left eye exhibits no discharge.  Neck: Neck supple.  Cardiovascular: Normal rate and regular rhythm.   Pulmonary/Chest: Effort normal and breath sounds normal. No respiratory distress.  Neurological: She is alert and oriented to person, place, and time.  Skin: Skin is warm and dry.  Psychiatric: She has a normal mood and affect. Her behavior is normal.  Nursing note and vitals reviewed.      Assessment & Plan:    Essential hypertension - Plan: Comprehensive metabolic panel  Special screening for malignant neoplasms, colon - Plan: Ambulatory referral to Gastroenterology  Signed, Robyn Haber, MD

## 2014-08-12 ENCOUNTER — Other Ambulatory Visit: Payer: Self-pay | Admitting: Family Medicine

## 2015-04-02 ENCOUNTER — Ambulatory Visit (INDEPENDENT_AMBULATORY_CARE_PROVIDER_SITE_OTHER): Payer: BLUE CROSS/BLUE SHIELD | Admitting: Family Medicine

## 2015-04-02 VITALS — BP 126/82 | HR 63 | Temp 98.0°F | Resp 18 | Ht 66.0 in | Wt 231.8 lb

## 2015-04-02 DIAGNOSIS — S90861A Insect bite (nonvenomous), right foot, initial encounter: Secondary | ICD-10-CM

## 2015-04-02 DIAGNOSIS — S80862A Insect bite (nonvenomous), left lower leg, initial encounter: Secondary | ICD-10-CM

## 2015-04-02 DIAGNOSIS — L299 Pruritus, unspecified: Secondary | ICD-10-CM

## 2015-04-02 DIAGNOSIS — S90862A Insect bite (nonvenomous), left foot, initial encounter: Secondary | ICD-10-CM

## 2015-04-02 DIAGNOSIS — W57XXXA Bitten or stung by nonvenomous insect and other nonvenomous arthropods, initial encounter: Secondary | ICD-10-CM | POA: Diagnosis not present

## 2015-04-02 DIAGNOSIS — S80861A Insect bite (nonvenomous), right lower leg, initial encounter: Secondary | ICD-10-CM

## 2015-04-02 MED ORDER — TRIAMCINOLONE ACETONIDE 0.1 % EX CREA: 1.0000 "application " | TOPICAL_CREAM | Freq: Two times a day (BID) | CUTANEOUS | Status: DC

## 2015-04-02 MED ORDER — METHYLPREDNISOLONE ACETATE 80 MG/ML IJ SUSP
80.0000 mg | Freq: Once | INTRAMUSCULAR | Status: AC
  Administered 2015-04-02: 80 mg via INTRAMUSCULAR

## 2015-04-02 NOTE — Progress Notes (Signed)
  Subjective:  Patient ID: Kathy Barron, female    DOB: August 29, 1961  Age: 54 y.o. MRN: [id number]  54 year old who stepped on and he'll last week. She was down in [place] for [place], and they were standing outside watching fireworks when she got bit on her feet and ankle. She put some cortisone cream on these places, and took a Benadryl. They seem to calm down initially, but then they got to where they're itching more. The itch a lot at nighttime which stress go to bed under the sheets they seem to itch more. She is not gotten them to go away so she came on in here. She is not diabetic.   Objective:   Pleasant alert lady with multiple insect bites primarily on the dorsum of both feet and the few on the ankle. None of the places have been badly excoriated and none look infected  Assessment & Plan:   Assessment: Multiple insect bites/ant stings Itching  Plan:  Depo-Medrol 80 IM Topical hydrocortisone Cetirizine Patient Instructions  Use the triamcinolone cream or 3 times daily on the bites  Take over-the-counter Zyrtec (cetirizine) once or twice daily for allergy and itching  You have received an injection of Depo-Medrol 80(a long-acting cortisone injection)  Return if any concern of infection     Dajaun Goldring, MD 04/02/2015

## 2015-04-02 NOTE — Patient Instructions (Signed)
Use the triamcinolone cream or 3 times daily on the bites  Take over-the-counter Zyrtec (cetirizine) once or twice daily for allergy and itching  You have received an injection of Depo-Medrol 80(a long-acting cortisone injection)  Return if any concern of infection

## 2015-07-03 ENCOUNTER — Ambulatory Visit (INDEPENDENT_AMBULATORY_CARE_PROVIDER_SITE_OTHER): Payer: BLUE CROSS/BLUE SHIELD | Admitting: Family Medicine

## 2015-07-03 VITALS — BP 150/86 | HR 89 | Temp 97.8°F | Resp 18 | Ht 65.5 in | Wt 231.4 lb

## 2015-07-03 DIAGNOSIS — L299 Pruritus, unspecified: Secondary | ICD-10-CM

## 2015-07-03 DIAGNOSIS — R21 Rash and other nonspecific skin eruption: Secondary | ICD-10-CM | POA: Diagnosis not present

## 2015-07-03 DIAGNOSIS — Z23 Encounter for immunization: Secondary | ICD-10-CM

## 2015-07-03 DIAGNOSIS — I10 Essential (primary) hypertension: Secondary | ICD-10-CM | POA: Diagnosis not present

## 2015-07-03 LAB — POCT CBC
GRANULOCYTE PERCENT: 73.4 % (ref 37–80)
HCT, POC: 37.6 % — AB (ref 37.7–47.9)
Hemoglobin: 11.9 g/dL — AB (ref 12.2–16.2)
LYMPH, POC: 2.3 (ref 0.6–3.4)
MCH, POC: 24.2 pg — AB (ref 27–31.2)
MCHC: 31.8 g/dL (ref 31.8–35.4)
MCV: 76 fL — AB (ref 80–97)
MID (cbc): 0.3 (ref 0–0.9)
MPV: 8.2 fL (ref 0–99.8)
PLATELET COUNT, POC: 283 10*3/uL (ref 142–424)
POC GRANULOCYTE: 7.2 — AB (ref 2–6.9)
POC LYMPH PERCENT: 23.3 %L (ref 10–50)
POC MID %: 3.3 % (ref 0–12)
RBC: 4.94 M/uL (ref 4.04–5.48)
RDW, POC: 14.5 %
WBC: 9.8 10*3/uL (ref 4.6–10.2)

## 2015-07-03 MED ORDER — LISINOPRIL-HYDROCHLOROTHIAZIDE 10-12.5 MG PO TABS: 1.0000 | ORAL_TABLET | Freq: Every day | ORAL | Status: DC

## 2015-07-03 MED ORDER — LISINOPRIL-HYDROCHLOROTHIAZIDE 20-12.5 MG PO TABS: 1.0000 | ORAL_TABLET | Freq: Every day | ORAL | Status: DC

## 2015-07-03 MED ORDER — BETAMETHASONE DIPROPIONATE 0.05 % EX CREA: TOPICAL_CREAM | Freq: Two times a day (BID) | CUTANEOUS | Status: DC

## 2015-07-03 NOTE — Progress Notes (Signed)
Patient ID: Kathy Barron, female    DOB: 19-May-1961  Age: 54 y.o. MRN: [id number]  Chief Complaint  Patient presents with  . Pruritis    Mainly on both arms off & on x 1 week. Noticed it more when she sweats or get anxious. Tried TAC cream on arm-helped temporarily  . Medication Refill    Needs BP medication refilled    Subjective:   Patient complains of itching on her arms primarily, but some on over. She gets a little rash in her axillary region of the last month. It comes and goes. She has tried using the triamcinolone cream on it and it may or may not have helped. She takes some Benadryl. She goes to [employer] 2 or 3 times a week. 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 She takes her blood pressure medications most of the time.  Current allergies, medications, problem list, past/family and social histories reviewed.  Objective:  BP 150/86 mmHg  Pulse 89  Temp(Src) 97.8 F (36.6 C) (Oral)  Resp 18  Ht 5' 5.5" (1.664 m)  Wt 231 lb 6 oz (104.951 kg)  BMI 37.90 kg/m2  SpO2 98%  Repeated the blood pressure at 136/86. Her TMs are normal. Eyes PERRLA. Throat clear. Neck supple without nodes. Chest clear. Heart regular without murmurs. Has a little papular rash in the lower axillary regions of both arms. None of the rash.  Assessment & Plan:   Assessment: 1. Essential hypertension   2. Rash and nonspecific skin eruption   3. Itching   4. Needs flu shot       Plan:   Orders Placed This Encounter  Procedures  . Flu Vaccine QUAD 36+ mos IM  . Lipid panel  . TSH  . COMPLETE METABOLIC PANEL WITH GFR  . POCT CBC    Meds ordered this encounter  Medications  . DISCONTD: lisinopril-hydrochlorothiazide (PRINZIDE,ZESTORETIC) 10-12.5 MG tablet    Sig: Take 1 tablet by mouth daily.    Dispense:  90 tablet    Refill:  3  . betamethasone dipropionate (DIPROLENE) 0.05 % cream    Sig: Apply  topically 2 (two) times daily.    Dispense:  30 g    Refill:  1  . lisinopril-hydrochlorothiazide (ZESTORETIC) 20-12.5 MG tablet    Sig: Take 1 tablet by mouth daily.    Dispense:  90 tablet    Refill:  3    Pharmacist: Please disregard the prescription for lisinopril HCT 10/12.5. I made a dose change.         Patient Instructions  Takes Zyrtec one daily at bedtime for itching (cetirizine)  Continue taking the lisinopril HCT 20/12.5 1 daily for blood pressure  Call Dr.Jyothi Mann to reschedule your colonoscopy. If they will not do so, call back here for a referral again.  Return in 6 months, sooner if problems  Use the betamethasone cream twice daily as needed on rash     Return in about 6 months (around 01/01/2016).   HOPPER,DAVID, MD 07/03/2015

## 2015-07-03 NOTE — Patient Instructions (Addendum)
Takes Zyrtec one daily at bedtime for itching (cetirizine)  Continue taking the lisinopril HCT 20/12.5 1 daily for blood pressure  Call Dr.Jyothi Mann to reschedule your colonoscopy. If they will not do so, call back here for a referral again.  Return in 6 months, sooner if problems  Use the betamethasone cream twice daily as needed on rash

## 2015-07-04 LAB — COMPLETE METABOLIC PANEL WITH GFR
ALT: 11 U/L (ref 6–29)
AST: 15 U/L (ref 10–35)
Albumin: 4.2 g/dL (ref 3.6–5.1)
Alkaline Phosphatase: 70 U/L (ref 33–130)
BUN: 10 mg/dL (ref 7–25)
CHLORIDE: 103 mmol/L (ref 98–110)
CO2: 25 mmol/L (ref 20–31)
Calcium: 9.6 mg/dL (ref 8.6–10.4)
Creat: 0.72 mg/dL (ref 0.50–1.05)
GFR, Est African American: >89 mL/min (ref >=60)
GFR, Est Non African American: >89 mL/min (ref >=60)
Glucose, Bld: 83 mg/dL (ref 65–99)
POTASSIUM: 4.5 mmol/L (ref 3.5–5.3)
SODIUM: 139 mmol/L (ref 135–146)
Total Bilirubin: 0.9 mg/dL (ref 0.2–1.2)
Total Protein: 7.3 g/dL (ref 6.1–8.1)

## 2015-07-04 LAB — LIPID PANEL
Cholesterol: 182 mg/dL (ref 125–200)
HDL: 56 mg/dL (ref >=46)
LDL CALC: 113 mg/dL (ref <130)
Total CHOL/HDL Ratio: 3.3 Ratio (ref <=5.0)
Triglycerides: 66 mg/dL (ref <150)
VLDL: 13 mg/dL (ref <30)

## 2015-07-04 LAB — TSH: TSH: 1.71 u[IU]/mL (ref 0.350–4.500)

## 2015-07-09 ENCOUNTER — Telehealth: Payer: Self-pay

## 2015-07-09 NOTE — Telephone Encounter (Signed)
Called and left VM with normal lab results.

## 2016-01-10 ENCOUNTER — Other Ambulatory Visit: Payer: Self-pay | Admitting: Obstetrics and Gynecology

## 2016-01-11 LAB — CYTOLOGY - PAP

## 2016-07-23 ENCOUNTER — Ambulatory Visit (INDEPENDENT_AMBULATORY_CARE_PROVIDER_SITE_OTHER): Payer: BLUE CROSS/BLUE SHIELD | Admitting: Family Medicine

## 2016-07-23 ENCOUNTER — Encounter: Payer: Self-pay | Admitting: Family Medicine

## 2016-07-23 VITALS — BP 180/94 | HR 91 | Temp 98.6°F | Resp 16 | Ht 67.0 in | Wt 233.0 lb

## 2016-07-23 DIAGNOSIS — E6609 Other obesity due to excess calories: Secondary | ICD-10-CM

## 2016-07-23 DIAGNOSIS — Z1211 Encounter for screening for malignant neoplasm of colon: Secondary | ICD-10-CM

## 2016-07-23 DIAGNOSIS — Z6836 Body mass index (BMI) 36.0-36.9, adult: Secondary | ICD-10-CM

## 2016-07-23 DIAGNOSIS — Z23 Encounter for immunization: Secondary | ICD-10-CM | POA: Diagnosis not present

## 2016-07-23 DIAGNOSIS — F430 Acute stress reaction: Secondary | ICD-10-CM

## 2016-07-23 DIAGNOSIS — IMO0001 Reserved for inherently not codable concepts without codable children: Secondary | ICD-10-CM

## 2016-07-23 DIAGNOSIS — I10 Essential (primary) hypertension: Secondary | ICD-10-CM | POA: Diagnosis not present

## 2016-07-23 DIAGNOSIS — Z800 Family history of malignant neoplasm of digestive organs: Secondary | ICD-10-CM

## 2016-07-23 LAB — COMPREHENSIVE METABOLIC PANEL
ALBUMIN: 4.3 g/dL (ref 3.6–5.1)
ALT: 11 U/L (ref 6–29)
AST: 15 U/L (ref 10–35)
Alkaline Phosphatase: 71 U/L (ref 33–130)
BUN: 10 mg/dL (ref 7–25)
CHLORIDE: 102 mmol/L (ref 98–110)
CO2: 27 mmol/L (ref 20–31)
CREATININE: 0.76 mg/dL (ref 0.50–1.05)
Calcium: 9.5 mg/dL (ref 8.6–10.4)
Glucose, Bld: 82 mg/dL (ref 65–99)
POTASSIUM: 4.3 mmol/L (ref 3.5–5.3)
SODIUM: 139 mmol/L (ref 135–146)
Total Bilirubin: 0.9 mg/dL (ref 0.2–1.2)
Total Protein: 7.4 g/dL (ref 6.1–8.1)

## 2016-07-23 LAB — TSH: TSH: 1.24 mIU/L

## 2016-07-23 LAB — LIPID PANEL
CHOL/HDL RATIO: 3.4 ratio (ref <=5.0)
Cholesterol: 199 mg/dL (ref 125–200)
HDL: 58 mg/dL (ref >=46)
LDL CALC: 130 mg/dL — AB (ref <130)
TRIGLYCERIDES: 56 mg/dL (ref <150)
VLDL: 11 mg/dL (ref <30)

## 2016-07-23 MED ORDER — LISINOPRIL-HYDROCHLOROTHIAZIDE 20-12.5 MG PO TABS: 1.0000 | ORAL_TABLET | Freq: Two times a day (BID) | ORAL | 1 refills | Status: DC

## 2016-07-23 NOTE — Patient Instructions (Addendum)
IF you received an x-ray today, you will receive an invoice from [facility]. Please contact [facility] at [phone] with questions or concerns regarding your invoice.   IF you received labwork today, you will receive an invoice from [employer]. Please contact [facility] at [phone] with questions or concerns regarding your invoice.   Our billing staff will not be able to assist you with questions regarding bills from these companies.  You will be contacted with the lab results as soon as they are available. The fastest way to get your results is to activate your My Chart account. Instructions are located on the last page of this paperwork. If you have not heard from [place] regarding the results in 2 weeks, please contact this office.      Colonoscopy A colonoscopy is an exam to look at the entire large intestine (colon). This exam can help find problems such as tumors, polyps, inflammation, and areas of bleeding. The exam takes about 1 hour.  LET [facility] CARE PROVIDER KNOW ABOUT:   Any allergies you have.  All medicines you are taking, including vitamins, herbs, eye drops, creams, and over-the-counter medicines.  Previous problems you or members of your family have had with the use of anesthetics.  Any blood disorders you have.  Previous surgeries you have had.  Medical conditions you have. RISKS AND COMPLICATIONS  Generally, this is a safe procedure. However, as with any procedure, complications can occur. Possible complications include:  Bleeding.  Tearing or rupture of the colon wall.  Reaction to medicines given during the exam.  Infection (rare). BEFORE THE PROCEDURE   Ask your health care provider about changing or stopping your regular medicines.  You may be prescribed an oral bowel prep. This involves drinking a large amount of medicated liquid, starting the day before your procedure. The liquid will cause you  to have multiple loose stools until your stool is almost clear or light green. This cleans out your colon in preparation for the procedure.  Do not eat or drink anything else once you have started the bowel prep, unless your health care provider tells you it is safe to do so.  Arrange for someone to drive you home after the procedure. PROCEDURE   You will be given medicine to help you relax (sedative).  You will lie on your side with your knees bent.  A long, flexible tube with a light and camera on the end (colonoscope) will be inserted through the rectum and into the colon. The camera sends video back to a computer screen as it moves through the colon. The colonoscope also releases carbon dioxide gas to inflate the colon. This helps your health care provider see the area better.  During the exam, your health care provider may take a small tissue sample (biopsy) to be examined under a microscope if any abnormalities are found.  The exam is finished when the entire colon has been viewed. AFTER THE PROCEDURE   Do not drive for 24 hours after the exam.  You may have a small amount of blood in your stool.  You may pass moderate amounts of gas and have mild abdominal cramping or bloating. This is caused by the gas used to inflate your colon during the exam.  Ask when your test results will be ready and how you will get your results. Make sure you get your test results.   This information is not intended to replace advice given to  you by your health care provider. Make sure you discuss any questions you have with your health care provider.   Document Released: 09/05/2000 Document Revised: 06/29/2013 Document Reviewed: 05/16/2013 Elsevier Interactive Patient Education [employer].

## 2016-07-23 NOTE — Progress Notes (Signed)
Chief Complaint  Patient presents with  . Medication Refill    lisinopril-hctz    HPI  Hypertension - Uncontrolled Hypertension: Patient is here for evaluation of elevated blood pressures.  Age at onset of elevated blood pressure:  3 YEARS AGO .Cardiac symptoms none. Patient denies chest pain, chest pressure/discomfort, exertional chest pressure/discomfort and palpitations.  Cardiovascular risk factors: hypertension. Use of agents associated with hypertension: none. History of target organ damage: none.  BP Readings from Last 3 Encounters:  07/23/16 (!) 180/94  07/03/15 (!) 150/86  04/02/15 126/82   Obesity Pt reports that she exercises 2-3 times a week for about an hour.  She does zumba and denies pain with exercise, no chest pain or palpitations.  She is avoiding salty foods.  She is working on limiting fatty foods and is doing more baking.  Wt Readings from Last 3 Encounters:  07/23/16 233 lb (105.7 kg)  07/03/15 231 lb 6 oz (105 kg)  04/02/15 231 lb 12.8 oz (105.1 kg)   Body mass index is 36.49 kg/m.   Stress reaction  Need for colon cancer screening She reports that she knew that her blood pressure would be high.  She works in Press photographer and her mother is undergoing chemo in [facility] and she is concerned about her.  She has not had her own colonoscopy.  No past medical history on file.  Current Outpatient Prescriptions  Medication Sig Dispense Refill  . lisinopril-hydrochlorothiazide (ZESTORETIC) 20-12.5 MG tablet Take 1 tablet by mouth 2 (two) times daily. 180 tablet 1  . betamethasone dipropionate (DIPROLENE) 0.05 % cream Apply topically 2 (two) times daily. (Patient not taking: Reported on 07/23/2016) 30 g 1  . triamcinolone cream (KENALOG) 0.1 % Apply 1 application topically 2 (two) times daily. (Patient not taking: Reported on 07/23/2016) 30 g 0   No current facility-administered medications for this visit.     Allergies: No Known Allergies  Past Surgical History:    Procedure Laterality Date  . CHOLECYSTECTOMY      Social History   Social History  . Marital status: Single    Spouse name: N/A  . Number of children: N/A  . Years of education: N/A   Social History Main Topics  . Smoking status: Never Smoker  . Smokeless tobacco: None  . Alcohol use None  . Drug use: Unknown  . Sexual activity: Not Asked   Other Topics Concern  . None   Social History Narrative  . None    Review of Systems  Constitutional: Negative for chills and fever.  Respiratory: Negative for cough, shortness of breath and wheezing.   Cardiovascular: Negative for chest pain and palpitations.  Gastrointestinal: Negative for abdominal pain, blood in stool, melena, nausea and vomiting.    Objective: Vitals:   07/23/16 1306 07/23/16 1330  BP: (!) 196/90 (!) 180/94  Pulse: 91   Resp: 16   Temp: 98.6 F (37 C)   SpO2: 98%   Weight: 233 lb (105.7 kg)   Height: 5\' 7"  (1.702 m)     Physical Exam  Constitutional: She is oriented to person, place, and time. She appears well-developed and well-nourished.  HENT:  Head: Normocephalic and atraumatic.  Eyes: Conjunctivae and EOM are normal.  Neck: Normal range of motion. Neck supple.  Cardiovascular: Normal rate, regular rhythm and normal heart sounds.   No murmur heard. Pulmonary/Chest: Effort normal and breath sounds normal. No respiratory distress.  Neurological: She is alert and oriented to person, place, and time.  Psychiatric:  She has a normal mood and affect. Her behavior is normal. Judgment and thought content normal.    Assessment and Plan Johannah was seen today for medication refill.  Diagnoses and all orders for this visit:  Uncontrolled hypertension-  Plan to increase lisinopril-hctz to bid Discussed with patient that she should check her blood pressures at home to see if her levels are improving and return in one month She reports compliance to medications and a low sodium diet Will check lytes  and renal function Discussed that she should mychart message her readings or return for nurse visit with her bp monitor to see if it is accurate Will plan on amlodipine if blood pressure is not controlled by increasing her lisinopril-hctz -     Comprehensive metabolic panel -     Microalbumin, urine  Need for influenza vaccination -     Flu Vaccine QUAD 36+ mos IM    Class 2 obesity due to excess calories with serious comorbidity and body mass index (BMI) of 36.0 to 36.9 in adult-  Will check lipid and thyroid Discussed that she should try to lose 5% of her weight to improve her blood pressure -     Lipid panel -     TSH  Family history of colon cancer in mother-  Discussed risk factors Will refer for colonoscopy Pt denies her own history of blood per rectum  Screening for colon cancer -     CT VIRTUAL COLONOSCOPY SCREENING; Future  Essential hypertension -     lisinopril-hydrochlorothiazide (ZESTORETIC) 20-12.5 MG tablet; Take 1 tablet by mouth 2 (two) times daily.  Stress reaction  A total of 45 minutes were spent face-to-face with the patient during this encounter and over half of that time was spent on counseling and coordination of care.  Discussed dietary considerations, lifestyle modifications as well as mechanism of action of medications and anticipated response to medications.     Shepherd

## 2016-07-24 LAB — MICROALBUMIN, URINE: MICROALB UR: 3.4 mg/dL

## 2016-08-20 ENCOUNTER — Ambulatory Visit: Payer: BLUE CROSS/BLUE SHIELD | Admitting: Family Medicine

## 2016-08-27 ENCOUNTER — Ambulatory Visit: Payer: BLUE CROSS/BLUE SHIELD | Admitting: Family Medicine

## 2016-09-03 ENCOUNTER — Ambulatory Visit (INDEPENDENT_AMBULATORY_CARE_PROVIDER_SITE_OTHER): Payer: BLUE CROSS/BLUE SHIELD | Admitting: Family Medicine

## 2016-09-03 ENCOUNTER — Encounter: Payer: Self-pay | Admitting: Family Medicine

## 2016-09-03 VITALS — BP 142/84 | HR 87 | Temp 98.9°F | Resp 16 | Wt 230.0 lb

## 2016-09-03 DIAGNOSIS — Z6836 Body mass index (BMI) 36.0-36.9, adult: Secondary | ICD-10-CM

## 2016-09-03 DIAGNOSIS — I10 Essential (primary) hypertension: Secondary | ICD-10-CM | POA: Diagnosis not present

## 2016-09-03 DIAGNOSIS — E6609 Other obesity due to excess calories: Secondary | ICD-10-CM | POA: Diagnosis not present

## 2016-09-03 DIAGNOSIS — IMO0001 Reserved for inherently not codable concepts without codable children: Secondary | ICD-10-CM

## 2016-09-03 NOTE — Patient Instructions (Signed)
     IF you received an x-ray today, you will receive an invoice from [facility]. Please contact [facility] at [phone] with questions or concerns regarding your invoice.   IF you received labwork today, you will receive an invoice from [facility]. Please contact [facility] at [phone] with questions or concerns regarding your invoice.   Our billing staff will not be able to assist you with questions regarding bills from these companies.  You will be contacted with the lab results as soon as they are available. The fastest way to get your results is to activate your My Chart account. Instructions are located on the last page of this paperwork. If you have not heard from us regarding the results in 2 weeks, please contact this office.      

## 2016-09-03 NOTE — Progress Notes (Signed)
Chief Complaint  Patient presents with  . Follow-up    BP    HPI Her blood pressure at home has been in the 130s/80s She is checking her blood pressure at home and her blood pressure has not been above the 140s She is on a DASH plan No chest pains or palpitation She reports that with initially starting the medication she had some dizziness but this resolved.  She states that now her body is tolerating it No new cough No side effects   BP Readings from Last 3 Encounters:  09/03/16 (!) 142/84  07/23/16 (!) 180/94  07/03/15 (!) 150/86   Obesity-  Pt reports that she is cutting portions She is beginning to exercise She is trying to increase exercise and weight loss to improve her bp Wt Readings from Last 3 Encounters:  09/03/16 230 lb (104.3 kg)  07/23/16 233 lb (105.7 kg)  07/03/15 231 lb 6 oz (105 kg)  Body mass index is 36.02 kg/m.    No past medical history on file.  Current Outpatient Prescriptions  Medication Sig Dispense Refill  . lisinopril-hydrochlorothiazide (ZESTORETIC) 20-12.5 MG tablet Take 1 tablet by mouth 2 (two) times daily. 180 tablet 1  . betamethasone dipropionate (DIPROLENE) 0.05 % cream Apply topically 2 (two) times daily. (Patient not taking: Reported on 09/03/2016) 30 g 1  . triamcinolone cream (KENALOG) 0.1 % Apply 1 application topically 2 (two) times daily. (Patient not taking: Reported on 09/03/2016) 30 g 0   No current facility-administered medications for this visit.     Allergies: No Known Allergies  Past Surgical History:  Procedure Laterality Date  . CHOLECYSTECTOMY      Social History   Social History  . Marital status: Single    Spouse name: N/A  . Number of children: N/A  . Years of education: N/A   Social History Main Topics  . Smoking status: Never Smoker  . Smokeless tobacco: None  . Alcohol use None  . Drug use: Unknown  . Sexual activity: Not Asked   Other Topics Concern  . None   Social History Narrative  .  None    Review of Systems  Constitutional: Negative for chills, fever, malaise/fatigue and weight loss.  Eyes: Negative for blurred vision, double vision, photophobia and pain.  Respiratory: Negative for cough, hemoptysis, sputum production, shortness of breath and wheezing.   Cardiovascular: Negative for chest pain, palpitations, orthopnea and claudication.  Gastrointestinal: Negative for abdominal pain, nausea and vomiting.  Skin: Negative for itching and rash.  Neurological: Negative for dizziness, tingling, tremors and headaches.    Objective: Vitals:   09/03/16 1320  BP: (!) 142/84  Pulse: 87  Resp: 16  Temp: 98.9 F (37.2 C)  SpO2: 96%  Weight: 230 lb (104.3 kg)    Physical Exam  Constitutional: She appears well-developed and well-nourished.  HENT:  Head: Normocephalic and atraumatic.  Eyes: Conjunctivae and EOM are normal.  Cardiovascular: Normal rate, regular rhythm and normal heart sounds.   No murmur heard. Pulmonary/Chest: Effort normal and breath sounds normal. No respiratory distress. She has no wheezes.  Musculoskeletal: Normal range of motion. She exhibits no edema.  Skin: Skin is warm. Capillary refill takes less than 2 seconds.     Assessment and Plan Marleta was seen today for follow-up.  Diagnoses and all orders for this visit:  Essential hypertension -  Continue bp monitoring -  Continue lisinopril-hctz bid for now and in 6 months can assess if medication can be reduced based  on lifestyle changes and med compliance  Obesity - continue your current strategy as weight loss improves blood pressure  Kathey Simer A Yousuf Ager

## 2017-02-09 ENCOUNTER — Other Ambulatory Visit: Payer: Self-pay | Admitting: Obstetrics and Gynecology

## 2017-02-11 LAB — CYTOLOGY - PAP

## 2017-07-27 ENCOUNTER — Encounter: Payer: Self-pay | Admitting: Family Medicine

## 2017-07-27 ENCOUNTER — Ambulatory Visit: Payer: BC Managed Care – PPO | Admitting: Family Medicine

## 2017-07-27 VITALS — BP 136/80 | HR 82 | Temp 98.4°F | Resp 16 | Ht 67.0 in | Wt 236.6 lb

## 2017-07-27 DIAGNOSIS — Z1211 Encounter for screening for malignant neoplasm of colon: Secondary | ICD-10-CM

## 2017-07-27 DIAGNOSIS — Z23 Encounter for immunization: Secondary | ICD-10-CM

## 2017-07-27 DIAGNOSIS — Z6837 Body mass index (BMI) 37.0-37.9, adult: Secondary | ICD-10-CM | POA: Diagnosis not present

## 2017-07-27 DIAGNOSIS — E66812 Other obesity due to excess calories: Secondary | ICD-10-CM

## 2017-07-27 DIAGNOSIS — I10 Essential (primary) hypertension: Secondary | ICD-10-CM

## 2017-07-27 DIAGNOSIS — E6609 Other obesity due to excess calories: Secondary | ICD-10-CM | POA: Diagnosis not present

## 2017-07-27 MED ORDER — LISINOPRIL-HYDROCHLOROTHIAZIDE 20-12.5 MG PO TABS: 1.0000 | ORAL_TABLET | Freq: Two times a day (BID) | ORAL | 1 refills | Status: DC

## 2017-07-27 NOTE — Patient Instructions (Signed)
     IF you received an x-ray today, you will receive an invoice from [facility]. Please contact [facility] at [phone] with questions or concerns regarding your invoice.   IF you received labwork today, you will receive an invoice from [facility]. Please contact [facility] at [phone] with questions or concerns regarding your invoice.   Our billing staff will not be able to assist you with questions regarding bills from these companies.  You will be contacted with the lab results as soon as they are available. The fastest way to get your results is to activate your My Chart account. Instructions are located on the last page of this paperwork. If you have not heard from us regarding the results in 2 weeks, please contact this office.     

## 2017-07-27 NOTE — Progress Notes (Signed)
Chief Complaint  Patient presents with  . Medication Refill    lisinopril-hctz    HPI   Hypertension: Patient here for follow-up of elevated blood pressure. She is exercising 2-3 times a week and is adherent to low salt diet.  Blood pressure is well controlled at home. Cardiac symptoms none. Patient denies chest pain, dyspnea, irregular heart beat, orthopnea, palpitations and paroxysmal nocturnal dyspnea.  Cardiovascular risk factors: hypertension and obesity (BMI >= 30 kg/m2). Use of agents associated with hypertension: none. History of target organ damage: none. BP Readings from Last 3 Encounters:  07/27/17 136/80  09/03/16 (!) 142/84  07/23/16 (!) 180/94   Obesity She reports that her weight has been fluctuating She reports that her clothes fit about the same She reports that she does zumba twice a week and add weights She typically has a small breakfast, usually she packs a lunch and typically has a dinner that is cooked at home 4 times a week She reports that she eats heavier later in the day. She tends to eat a heavy dinner in the evenings.  Wt Readings from Last 3 Encounters:  07/27/17 236 lb 9.6 oz (107.3 kg)  09/03/16 230 lb (104.3 kg)  07/23/16 233 lb (105.7 kg)   Body mass index is 37.06 kg/m.    No past medical history on file.  Current Outpatient Medications  Medication Sig Dispense Refill  . lisinopril-hydrochlorothiazide (ZESTORETIC) 20-12.5 MG tablet Take 1 tablet by mouth 2 (two) times daily. 180 tablet 1   No current facility-administered medications for this visit.     Allergies: No Known Allergies  Past Surgical History:  Procedure Laterality Date  . CHOLECYSTECTOMY      Social History   Socioeconomic History  . Marital status: Single    Spouse name: Not on file  . Number of children: Not on file  . Years of education: Not on file  . Highest education level: Not on file  Social Needs  . Financial resource strain: Not on file  . Food  insecurity - worry: Not on file  . Food insecurity - inability: Not on file  . Transportation needs - medical: Not on file  . Transportation needs - non-medical: Not on file  Occupational History  . Not on file  Tobacco Use  . Smoking status: Never Smoker  . Smokeless tobacco: Never Used  Substance and Sexual Activity  . Alcohol use: No    Frequency: Never  . Drug use: No  . Sexual activity: Not on file  Other Topics Concern  . Not on file  Social History Narrative  . Not on file    Family History  Problem Relation Age of Onset  . Hyperlipidemia Mother   . Cancer Father      ROS Review of Systems See HPI Constitution: No fevers or chills No malaise No diaphoresis Skin: No rash or itching Eyes: no blurry vision, no double vision GU: no dysuria or hematuria Neuro: no dizziness or headaches   Objective: Vitals:   07/27/17 1552  BP: 136/80  Pulse: 82  Resp: 16  Temp: 98.4 F (36.9 C)  TempSrc: Oral  SpO2: 95%  Weight: 236 lb 9.6 oz (107.3 kg)  Height: 5\' 7"  (1.702 m)   Physical Exam  Constitutional: She is oriented to person, place, and time. She appears well-developed and well-nourished.  HENT:  Head: Normocephalic and atraumatic.  Eyes: Conjunctivae and EOM are normal.  Cardiovascular: Normal rate, regular rhythm and normal heart sounds.  Pulmonary/Chest:  Effort normal and breath sounds normal. No stridor. No respiratory distress.  Neurological: She is alert and oriented to person, place, and time.  Skin: Skin is warm. Capillary refill takes less than 2 seconds.  Psychiatric: She has a normal mood and affect. Her behavior is normal. Judgment and thought content normal.     Assessment and Plan Kathy Barron was seen today for medication refill.  Diagnoses and all orders for this visit:  Essential hypertension- bp at goal, will check labs -     Lipid panel -     Comprehensive metabolic panel  Need for prophylactic vaccination and inoculation against  influenza -     Flu Vaccine QUAD 36+ mos IM  Class 2 obesity due to excess calories without serious comorbidity with body mass index (BMI) of 37.0 to 37.9 in adult- advised exercise and weight loss -     Lipid panel -     Comprehensive metabolic panel  Other orders -     Tdap vaccine greater than or equal to 7yo IM  Colon cancer screening -   Pt will sign for cologuard Discussed screening   Jadakiss Barish A Nolon Rod

## 2017-07-28 ENCOUNTER — Telehealth: Payer: Self-pay

## 2017-07-28 LAB — LIPID PANEL
CHOL/HDL RATIO: 3.5 ratio (ref 0.0–4.4)
Cholesterol, Total: 216 mg/dL — ABNORMAL HIGH (ref 100–199)
HDL: 62 mg/dL (ref >39)
LDL CALC: 137 mg/dL — AB (ref 0–99)
TRIGLYCERIDES: 86 mg/dL (ref 0–149)
VLDL Cholesterol Cal: 17 mg/dL (ref 5–40)

## 2017-07-28 LAB — COMPREHENSIVE METABOLIC PANEL
A/G RATIO: 1.4 (ref 1.2–2.2)
ALT: 12 IU/L (ref 0–32)
AST: 20 IU/L (ref 0–40)
Albumin: 4.4 g/dL (ref 3.5–5.5)
Alkaline Phosphatase: 88 IU/L (ref 39–117)
BUN/Creatinine Ratio: 12 (ref 9–23)
BUN: 10 mg/dL (ref 6–24)
Bilirubin Total: 0.7 mg/dL (ref 0.0–1.2)
CALCIUM: 9.5 mg/dL (ref 8.7–10.2)
CHLORIDE: 100 mmol/L (ref 96–106)
CO2: 21 mmol/L (ref 20–29)
Creatinine, Ser: 0.82 mg/dL (ref 0.57–1.00)
GFR calc Af Amer: 92 mL/min/{1.73_m2} (ref >59)
GFR, EST NON AFRICAN AMERICAN: 80 mL/min/{1.73_m2} (ref >59)
GLUCOSE: 87 mg/dL (ref 65–99)
Globulin, Total: 3.2 g/dL (ref 1.5–4.5)
POTASSIUM: 4.4 mmol/L (ref 3.5–5.2)
Sodium: 139 mmol/L (ref 134–144)
TOTAL PROTEIN: 7.6 g/dL (ref 6.0–8.5)

## 2017-07-28 NOTE — Telephone Encounter (Signed)
Spoke with pt via phone advise I would leave cologuard form for her to complete her part and sign and we will fax back.  Pt also will p/u AVS from 11/05/218. DGaddy, CMA

## 2018-07-29 ENCOUNTER — Encounter: Payer: Self-pay | Admitting: Family Medicine

## 2018-07-29 ENCOUNTER — Other Ambulatory Visit: Payer: Self-pay

## 2018-07-29 ENCOUNTER — Ambulatory Visit: Payer: BC Managed Care – PPO | Admitting: Family Medicine

## 2018-07-29 VITALS — BP 148/78 | HR 89 | Temp 97.7°F | Resp 16 | Ht 67.0 in | Wt 253.8 lb

## 2018-07-29 DIAGNOSIS — Z23 Encounter for immunization: Secondary | ICD-10-CM

## 2018-07-29 DIAGNOSIS — Z833 Family history of diabetes mellitus: Secondary | ICD-10-CM

## 2018-07-29 DIAGNOSIS — I10 Essential (primary) hypertension: Secondary | ICD-10-CM | POA: Diagnosis not present

## 2018-07-29 DIAGNOSIS — Z1211 Encounter for screening for malignant neoplasm of colon: Secondary | ICD-10-CM | POA: Diagnosis not present

## 2018-07-29 DIAGNOSIS — Z6837 Body mass index (BMI) 37.0-37.9, adult: Secondary | ICD-10-CM

## 2018-07-29 DIAGNOSIS — E6609 Other obesity due to excess calories: Secondary | ICD-10-CM

## 2018-07-29 DIAGNOSIS — D259 Leiomyoma of uterus, unspecified: Secondary | ICD-10-CM | POA: Insufficient documentation

## 2018-07-29 NOTE — Progress Notes (Signed)
Chief Complaint  Patient presents with  . Hypertension    need refills on lisinopril/hctz    HPI  Hypertension: Patient here for follow-up of elevated blood pressure. She is not exercising and is adherent to low salt diet.  Blood pressure is well controlled at home. She takes her bp medication once in the morning and once in the evening. Cardiac symptoms none. Patient denies chest pain, chest pressure/discomfort, claudication, dyspnea, exertional chest pressure/discomfort, fatigue, irregular heart beat, lower extremity edema, near-syncope, orthopnea and palpitations.  Cardiovascular risk factors: dyslipidemia, hypertension and obesity (BMI >= 30 kg/m2). Use of agents associated with hypertension: none. History of target organ damage: none. BP Readings from Last 3 Encounters:  07/29/18 (!) 148/78  07/27/17 136/80  09/03/16 (!) 142/84    Colon Cancer Screening she has never had a colonoscopy she denies blood in his stool, unexpected weight loss or pain with defecation No rectal itching she does not smoke se does not have a family history of colon cancer   No past medical history on file.  Current Outpatient Medications  Medication Sig Dispense Refill  . lisinopril-hydrochlorothiazide (ZESTORETIC) 20-12.5 MG tablet Take 1 tablet 2 (two) times daily by mouth. 180 tablet 1   No current facility-administered medications for this visit.     Allergies: No Known Allergies  Past Surgical History:  Procedure Laterality Date  . CHOLECYSTECTOMY      Social History   Socioeconomic History  . Marital status: Single    Spouse name: Not on file  . Number of children: Not on file  . Years of education: Not on file  . Highest education level: Not on file  Occupational History  . Not on file  Social Needs  . Financial resource strain: Not on file  . Food insecurity:    Worry: Not on file    Inability: Not on file  . Transportation needs:    Medical: Not on file    Non-medical:  Not on file  Tobacco Use  . Smoking status: Never Smoker  . Smokeless tobacco: Never Used  Substance and Sexual Activity  . Alcohol use: No    Frequency: Never  . Drug use: No  . Sexual activity: Not on file  Lifestyle  . Physical activity:    Days per week: Not on file    Minutes per session: Not on file  . Stress: Not on file  Relationships  . Social connections:    Talks on phone: Not on file    Gets together: Not on file    Attends religious service: Not on file    Active member of club or organization: Not on file    Attends meetings of clubs or organizations: Not on file    Relationship status: Not on file  Other Topics Concern  . Not on file  Social History Narrative  . Not on file    Family History  Problem Relation Age of Onset  . Hyperlipidemia Mother   . Cancer Father      ROS Review of Systems See HPI Constitution: No fevers or chills No malaise No diaphoresis Skin: No rash or itching Eyes: no blurry vision, no double vision GU: no dysuria or hematuria Neuro: no dizziness or headaches all others reviewed and negative   Objective: Vitals:   07/29/18 1110 07/29/18 1144  BP: (!) 175/91 (!) 148/78  Pulse: 89   Resp: 16   Temp: 97.7 F (36.5 C)   TempSrc: Oral   SpO2: 98%  Weight: 253 lb 12.8 oz (115.1 kg)   Height: 5\' 7"  (1.702 m)     Physical Exam Physical Exam  Constitutional: She is oriented to person, place, and time. She appears well-developed and well-nourished.  HENT:  Head: Normocephalic and atraumatic.  Eyes: Conjunctivae and EOM are normal.  Cardiovascular: Normal rate, regular rhythm and normal heart sounds.   Pulmonary/Chest: Effort normal and breath sounds normal. No respiratory distress. She has no wheezes.  Abdominal: Normal appearance and bowel sounds are normal. There is no tenderness. There is no CVA tenderness.  Extremities: no edema, symmetric Neurological: She is alert and oriented to person, place, and time.     Assessment and Plan Charda was seen today for hypertension.  Diagnoses and all orders for this visit:  Essential hypertension- improved, asked pt to consider taking both pills in the morning to cut down on nocturia and evenly control bp -     Hemoglobin A1c -     Lipid panel -     Comprehensive metabolic panel  Class 2 obesity due to excess calories without serious comorbidity with body mass index (BMI) of 37.0 to 37.9 in adult- discussed exercise  Heart disease prevention reviewed Reviewed labs from 2018 -     Hemoglobin A1c -     Lipid panel -     Comprehensive metabolic panel  Screening for colon cancer- discussed screening, prep and aftercare -     Ambulatory referral to Gastroenterology  Family history of diabetes mellitus in first degree relative -     Hemoglobin A1c  Other orders -     Flu Vaccine QUAD 36+ mos IM     Maisen Schmit A Kambre Messner

## 2018-07-29 NOTE — Patient Instructions (Addendum)
If you have lab work done today you will be contacted with your lab results within the next 2 weeks.  If you have not heard from [place] then please contact us. The fastest way to get your results is to register for My Chart.   IF you received an x-ray today, you will receive an invoice from [facility]. Please contact [facility] at [phone] with questions or concerns regarding your invoice.   IF you received labwork today, you will receive an invoice from [facility]. Please contact [facility] at [phone] with questions or concerns regarding your invoice.   Our billing staff will not be able to assist you with questions regarding bills from these companies.  You will be contacted with the lab results as soon as they are available. The fastest way to get your results is to activate your My Chart account. Instructions are located on the last page of this paperwork. If you have not heard from [place] regarding the results in 2 weeks, please contact this office.      Colonoscopy, Adult A colonoscopy is an exam to look at the entire large intestine. During the exam, a lubricated, bendable tube is inserted into the anus and then passed into the rectum, colon, and other parts of the large intestine. A colonoscopy is often done as a part of normal colorectal screening or in response to certain symptoms, such as anemia, persistent diarrhea, abdominal pain, and blood in the stool. The exam can help screen for and diagnose medical problems, including:  Tumors.  Polyps.  Inflammation.  Areas of bleeding.  Tell a health care provider about:  Any allergies you have.  All medicines you are taking, including vitamins, herbs, eye drops, creams, and over-the-counter medicines.  Any problems you or family members have had with anesthetic medicines.  Any blood disorders you have.  Any surgeries you have had.  Any medical conditions you have.  Any problems you have had passing  stool. What are the risks? Generally, this is a safe procedure. However, problems may occur, including:  Bleeding.  A tear in the intestine.  A reaction to medicines given during the exam.  Infection (rare).  What happens before the procedure? Eating and drinking restrictions Follow instructions from your health care provider about eating and drinking, which may include:  A few days before the procedure - follow a low-fiber diet. Avoid nuts, seeds, dried fruit, raw fruits, and vegetables.  1-3 days before the procedure - follow a clear liquid diet. Drink only clear liquids, such as clear broth or bouillon, black coffee or tea, clear juice, clear soft drinks or sports drinks, gelatin dessert, and popsicles. Avoid any liquids that contain red or purple dye.  On the day of the procedure - do not eat or drink anything during the 2 hours before the procedure, or within the time period that your health care provider recommends.  Bowel prep If you were prescribed an oral bowel prep to clean out your colon:  Take it as told by your health care provider. Starting the day before your procedure, you will need to drink a large amount of medicated liquid. The liquid will cause you to have multiple loose stools until your stool is almost clear or light green.  If your skin or anus gets irritated from diarrhea, you may use these to relieve the irritation: ? Medicated wipes, such as adult wet wipes with aloe and vitamin E. ? A skin soothing-product like petroleum jelly.  If you vomit while drinking the bowel prep, take a break for up to 60 minutes and then begin the bowel prep again. If vomiting continues and you cannot take the bowel prep without vomiting, call your health care provider.  General instructions  Ask your health care provider about changing or stopping your regular medicines. This is especially important if you are taking diabetes medicines or blood thinners.  Plan to have someone  take you home from the hospital or clinic. What happens during the procedure?  An IV tube may be inserted into one of your veins.  You will be given medicine to help you relax (sedative).  To reduce your risk of infection: ? Your health care team will wash or sanitize their hands. ? Your anal area will be washed with soap.  You will be asked to lie on your side with your knees bent.  Your health care provider will lubricate a long, thin, flexible tube. The tube will have a camera and a light on the end.  The tube will be inserted into your anus.  The tube will be gently eased through your rectum and colon.  Air will be delivered into your colon to keep it open. You may feel some pressure or cramping.  The camera will be used to take images during the procedure.  A small tissue sample may be removed from your body to be examined under a microscope (biopsy). If any potential problems are found, the tissue will be sent to a lab for testing.  If small polyps are found, your health care provider may remove them and have them checked for cancer cells.  The tube that was inserted into your anus will be slowly removed. The procedure may vary among health care providers and hospitals. What happens after the procedure?  Your blood pressure, heart rate, breathing rate, and blood oxygen level will be monitored until the medicines you were given have worn off.  Do not drive for 24 hours after the exam.  You may have a small amount of blood in your stool.  You may pass gas and have mild abdominal cramping or bloating due to the air that was used to inflate your colon during the exam.  It is up to you to get the results of your procedure. Ask your health care provider, or the department performing the procedure, when your results will be ready. This information is not intended to replace advice given to you by your health care provider. Make sure you discuss any questions you have with your  health care provider. Document Released: 09/05/2000 Document Revised: 07/09/2016 Document Reviewed: 11/20/2015 Elsevier Interactive Patient Education  2018 [employer].

## 2018-07-30 LAB — COMPREHENSIVE METABOLIC PANEL
ALT: 16 IU/L (ref 0–32)
AST: 19 IU/L (ref 0–40)
Albumin/Globulin Ratio: 1.4 (ref 1.2–2.2)
Albumin: 4.3 g/dL (ref 3.5–5.5)
Alkaline Phosphatase: 85 IU/L (ref 39–117)
BILIRUBIN TOTAL: 0.7 mg/dL (ref 0.0–1.2)
BUN/Creatinine Ratio: 14 (ref 9–23)
BUN: 11 mg/dL (ref 6–24)
CHLORIDE: 100 mmol/L (ref 96–106)
CO2: 26 mmol/L (ref 20–29)
CREATININE: 0.81 mg/dL (ref 0.57–1.00)
Calcium: 9.6 mg/dL (ref 8.7–10.2)
GFR calc non Af Amer: 80 mL/min/{1.73_m2} (ref >59)
GFR, EST AFRICAN AMERICAN: 93 mL/min/{1.73_m2} (ref >59)
Globulin, Total: 3 g/dL (ref 1.5–4.5)
Glucose: 78 mg/dL (ref 65–99)
Potassium: 4.1 mmol/L (ref 3.5–5.2)
Sodium: 140 mmol/L (ref 134–144)
TOTAL PROTEIN: 7.3 g/dL (ref 6.0–8.5)

## 2018-07-30 LAB — LIPID PANEL
CHOL/HDL RATIO: 3.6 ratio (ref 0.0–4.4)
Cholesterol, Total: 210 mg/dL — ABNORMAL HIGH (ref 100–199)
HDL: 58 mg/dL (ref >39)
LDL Calculated: 133 mg/dL — ABNORMAL HIGH (ref 0–99)
Triglycerides: 97 mg/dL (ref 0–149)
VLDL Cholesterol Cal: 19 mg/dL (ref 5–40)

## 2018-07-30 LAB — HEMOGLOBIN A1C
ESTIMATED AVERAGE GLUCOSE: 103 mg/dL
HEMOGLOBIN A1C: 5.2 % (ref 4.8–5.6)

## 2018-08-08 ENCOUNTER — Other Ambulatory Visit: Payer: Self-pay | Admitting: Family Medicine

## 2018-08-08 DIAGNOSIS — I10 Essential (primary) hypertension: Secondary | ICD-10-CM

## 2018-08-10 NOTE — Telephone Encounter (Signed)
Requested Prescriptions  Pending Prescriptions Disp Refills  . lisinopril-hydrochlorothiazide (PRINZIDE,ZESTORETIC) 20-12.5 MG tablet [Pharmacy Med Name: LISINOPRIL-HCTZ 20/12.5MG  TABLETS] 180 tablet 1    Sig: TAKE 1 TABLET BY MOUTH TWICE A DAY     Cardiovascular:  ACEI + Diuretic Combos Failed - 08/08/2018  5:38 PM      Failed - Last BP in normal range    BP Readings from Last 1 Encounters:  07/29/18 (!) 148/78         Passed - Na in normal range and within 180 days    Sodium  Date Value Ref Range Status  07/29/2018 140 134 - 144 mmol/L Final         Passed - K in normal range and within 180 days    Potassium  Date Value Ref Range Status  07/29/2018 4.1 3.5 - 5.2 mmol/L Final         Passed - Cr in normal range and within 180 days    Creat  Date Value Ref Range Status  07/23/2016 0.76 0.50 - 1.05 mg/dL Final    Comment:      For patients > or = 58 years of age: The upper reference limit for Creatinine is approximately 13% higher for people identified as African-American.      Creatinine, Ser  Date Value Ref Range Status  07/29/2018 0.81 0.57 - 1.00 mg/dL Final         Passed - Ca in normal range and within 180 days    Calcium  Date Value Ref Range Status  07/29/2018 9.6 8.7 - 10.2 mg/dL Final         Passed - Patient is not pregnant      Passed - Valid encounter within last 6 months    Recent Outpatient Visits          1 week ago Essential hypertension   Primary Care at [facility], Arlie Solomons, MD   1 year ago Essential hypertension   Primary Care at [facility], Arlie Solomons, MD   1 year ago Essential hypertension   Primary Care at [facility] [place], MD   2 years ago Uncontrolled hypertension   Primary Care at [facility], Arlie Solomons, MD   3 years ago Essential hypertension   Primary Care at [facility], Fenton Malling, MD      Future Appointments            In 5 months Forrest Moron, MD Primary Care at [place], [facility]

## 2018-11-03 ENCOUNTER — Encounter: Payer: Self-pay | Admitting: Family Medicine

## 2019-01-21 ENCOUNTER — Other Ambulatory Visit: Payer: Self-pay

## 2019-01-21 DIAGNOSIS — I10 Essential (primary) hypertension: Secondary | ICD-10-CM

## 2019-01-21 DIAGNOSIS — Z1159 Encounter for screening for other viral diseases: Secondary | ICD-10-CM

## 2019-01-21 DIAGNOSIS — Z114 Encounter for screening for human immunodeficiency virus [HIV]: Secondary | ICD-10-CM

## 2019-01-27 ENCOUNTER — Other Ambulatory Visit: Payer: Self-pay

## 2019-01-27 ENCOUNTER — Encounter: Payer: Self-pay | Admitting: Family Medicine

## 2019-01-27 ENCOUNTER — Telehealth (INDEPENDENT_AMBULATORY_CARE_PROVIDER_SITE_OTHER): Payer: BC Managed Care – PPO | Admitting: Family Medicine

## 2019-01-27 VITALS — Ht 67.0 in | Wt 254.0 lb

## 2019-01-27 DIAGNOSIS — I10 Essential (primary) hypertension: Secondary | ICD-10-CM | POA: Diagnosis not present

## 2019-01-27 DIAGNOSIS — E6609 Other obesity due to excess calories: Secondary | ICD-10-CM | POA: Diagnosis not present

## 2019-01-27 DIAGNOSIS — M5431 Sciatica, right side: Secondary | ICD-10-CM

## 2019-01-27 DIAGNOSIS — Z6837 Body mass index (BMI) 37.0-37.9, adult: Secondary | ICD-10-CM

## 2019-01-27 MED ORDER — GABAPENTIN 300 MG PO CAPS: 300.0000 mg | ORAL_CAPSULE | Freq: Two times a day (BID) | ORAL | 1 refills | Status: DC

## 2019-01-27 MED ORDER — LISINOPRIL-HYDROCHLOROTHIAZIDE 20-12.5 MG PO TABS: 1.0000 | ORAL_TABLET | Freq: Two times a day (BID) | ORAL | 1 refills | Status: DC

## 2019-01-27 NOTE — Progress Notes (Signed)
Telemedicine Encounter- SOAP NOTE Established Patient  This telephone encounter was conducted with the patient's (or proxy's) verbal consent via audio telecommunications: yes/no: Yes Patient was instructed to have this encounter in a suitably private space; and to only have persons present to whom they give permission to participate. In addition, patient identity was confirmed by use of name plus two identifiers (DOB and address).  I discussed the limitations, risks, security and privacy concerns of performing an evaluation and management service by telephone and the availability of in person appointments. I also discussed with the patient that there may be a patient responsible charge related to this service. The patient expressed understanding and agreed to proceed.  I spent a total of TIME; 0 MIN TO 60 MIN: 25 minutes talking with the patient or their proxy.  CC: right sided sciatica  Subjective   Kathy Barron is a 58 y.o. established patient. Telephone visit today for  HPI  Sciatica - Right side Onset 4 days ago, after she increased her walking exercise This is not her first episode, last episode was 5 years ago Started in the lower back and radiates down the to thigh, knee and leg She states that her pain is 6/10 She states that she has some numbness and tingling in her right lower leg She denies numbness in her groin She denies foot drop She denies incontinence of urine tyelnol arthritis and alleve did not help She has done icy hot and ice packs  Hypertension: Patient here for follow-up of elevated blood pressure. She is exercising and is adherent to low salt diet.  Blood pressure is well controlled at home. Cardiac symptoms none. Patient denies chest pressure/discomfort, dyspnea, fatigue, near-syncope and orthopnea.  Cardiovascular risk factors: hypertension and obesity (BMI >= 30 kg/m2). Use of agents associated with hypertension: none. History of target organ damage: none.  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  Wt Readings from Last 3 Encounters:  01/27/19 254 lb (115.2 kg)  07/29/18 253 lb 12.8 oz (115.1 kg)  07/27/17 236 lb 9.6 oz (107.3 kg)     Patient Active Problem List   Diagnosis Date Noted  . Uterine leiomyoma 07/29/2018  . Class 2 obesity due to excess calories without serious comorbidity with body mass index (BMI) of 37.0 to 37.9 in adult 07/27/2017  . Essential hypertension 07/23/2016    No past medical history on file.  Current Outpatient Medications  Medication Sig Dispense Refill  . lisinopril-hydrochlorothiazide (ZESTORETIC) 20-12.5 MG tablet Take 1 tablet by mouth 2 (two) times daily. 180 tablet 1  . gabapentin (NEURONTIN) 300 MG capsule Take 1 capsule (300 mg total) by mouth 2 (two) times daily. 60 capsule 1   No current facility-administered medications for this visit.     No Known Allergies  Social History   Socioeconomic History  . Marital status: Single    Spouse name: Not on file  . Number of children: Not on file  . Years of education: Not on file  . Highest education level: Not on file  Occupational History  . Not on file  Social Needs  . Financial resource strain: Not on file  .  Food insecurity:    Worry: Not on file    Inability: Not on file  . Transportation needs:    Medical: Not on file    Non-medical: Not on file  Tobacco Use  . Smoking status: Never Smoker  . Smokeless tobacco: Never Used  Substance and Sexual Activity  . Alcohol use: No    Frequency: Never  . Drug use: No  . Sexual activity: Not on file  Lifestyle  . Physical activity:    Days per week: Not on file    Minutes per session: Not on  file  . Stress: Not on file  Relationships  . Social connections:    Talks on phone: Not on file    Gets together: Not on file    Attends religious service: Not on file    Active member of club or organization: Not on file    Attends meetings of clubs or organizations: Not on file    Relationship status: Not on file  . Intimate partner violence:    Fear of current or ex partner: Not on file    Emotionally abused: Not on file    Physically abused: Not on file    Forced sexual activity: Not on file  Other Topics Concern  . Not on file  Social History Narrative  . Not on file    ROS Review of Systems  Constitutional: Negative for activity change, appetite change, chills and fever.  HENT: Negative for congestion, nosebleeds, trouble swallowing and voice change.   Respiratory: Negative for cough, shortness of breath and wheezing.   Gastrointestinal: Negative for diarrhea, nausea and vomiting.  Genitourinary: Negative for difficulty urinating, dysuria, flank pain and hematuria.  Musculoskeletal: + back pain, no joint swelling and neck pain.  Neurological: Negative for dizziness, speech difficulty, light-headedness and numbness.  See HPI. All other review of systems negative.   Objective   Vitals as reported by the patient: Today's Vitals   01/27/19 1153  Weight: 254 lb (115.2 kg)  Height: _0  (1.702 m)    Diagnoses and all orders for this visit:  Sciatica of right side- discussed continue topical care, add stretching, add gabapentin -     gabapentin (NEURONTIN) 300 MG capsule; Take 1 capsule (300 mg total) by mouth 2 (two) times daily.  Essential hypertension- bp at goal, cpm, DASH diet advised -     lisinopril-hydrochlorothiazide (ZESTORETIC) 20-12.5 MG tablet; Take 1 tablet by mouth 2 (two) times daily. -     CMP14+EGFR; Future  Class 2 obesity due to excess calories without serious comorbidity with body mass index (BMI) of 37.0 to 37.9 in adult -     Continue  exercise     I discussed the assessment and treatment plan with the patient. The patient was provided an opportunity to ask questions and all were answered. The patient agreed with the plan and demonstrated an understanding of the instructions.   The patient was advised to call back or seek an in-person evaluation if the symptoms worsen or if the condition fails to improve as anticipated.  I provided 25 minutes of non-face-to-face time during this encounter.  Forrest Moron, MD  Primary Care at [facility]

## 2019-01-27 NOTE — Patient Instructions (Addendum)
If you have lab work done today you will be contacted with your lab results within the next 2 weeks.  If you have not heard from [place] then please contact us. The fastest way to get your results is to register for My Chart.   IF you received an x-ray today, you will receive an invoice from [facility]. Please contact [facility] at [phone] with questions or concerns regarding your invoice.   IF you received labwork today, you will receive an invoice from [facility]. Please contact [facility] at [phone] with questions or concerns regarding your invoice.   Our billing staff will not be able to assist you with questions regarding bills from these companies.  You will be contacted with the lab results as soon as they are available. The fastest way to get your results is to activate your My Chart account. Instructions are located on the last page of this paperwork. If you have not heard from [place] regarding the results in 2 weeks, please contact this office.     Sciatica Rehab Ask your health care provider which exercises are safe for you. Do exercises exactly as told by your health care provider and adjust them as directed. It is normal to feel mild stretching, pulling, tightness, or discomfort as you do these exercises, but you should stop right away if you feel sudden pain or your pain gets worse.Do not begin these exercises until told by your health care provider. Stretching and range of motion exercises These exercises warm up your muscles and joints and improve the movement and flexibility of your hips and your back. These exercises also help to relieve pain, numbness, and tingling. Exercise A: Sciatic nerve glide 1. Sit in a chair with your head facing down toward your chest. Place your hands behind your back. Let your shoulders slump forward. 2. Slowly straighten one of your knees while you tilt your head back as if you are looking toward the ceiling. Only straighten  your leg as far as you can without making your symptoms worse. 3. Hold for __________ seconds. 4. Slowly return to the starting position. 5. Repeat with your other leg. Repeat __________ times. Complete this exercise __________ times a day. Exercise B: Knee to chest with hip adduction and internal rotation  1. Lie on your back on a firm surface with both legs straight. 2. Bend one of your knees and move it up toward your chest until you feel a gentle stretch in your lower back and buttock. Then, move your knee toward the shoulder that is on the opposite side from your leg. ? Hold your leg in this position by holding onto the front of your knee. 3. Hold for __________ seconds. 4. Slowly return to the starting position. 5. Repeat with your other leg. Repeat __________ times. Complete this exercise __________ times a day. Exercise C: Prone extension on elbows  1. Lie on your abdomen on a firm surface. A bed may be too soft for this exercise. 2. Prop yourself up on your elbows. 3. Use your arms to help lift your chest up until you feel a gentle stretch in your abdomen and your lower back. ? This will place some of your body weight on your elbows. If this is uncomfortable, try stacking pillows under your chest. ? Your hips should stay down, against the surface that you are lying on. Keep your hip and back muscles relaxed. 4. Hold for __________ seconds. 5. Slowly relax your upper body  and return to the starting position. Repeat __________ times. Complete this exercise __________ times a day. Strengthening exercises These exercises build strength and endurance in your back. Endurance is the ability to use your muscles for a long time, even after they get tired. Exercise D: Pelvic tilt 1. Lie on your back on a firm surface. Bend your knees and keep your feet flat. 2. Tense your abdominal muscles. Tip your pelvis up toward the ceiling and flatten your lower back into the floor. ? To help with  this exercise, you may place a small towel under your lower back and try to push your back into the towel. 3. Hold for __________ seconds. 4. Let your muscles relax completely before you repeat this exercise. Repeat __________ times. Complete this exercise __________ times a day. Exercise E: Alternating arm and leg raises  1. Get on your hands and knees on a firm surface. If you are on a hard floor, you may want to use padding to cushion your knees, such as an exercise mat. 2. Line up your arms and legs. Your hands should be below your shoulders, and your knees should be below your hips. 3. Lift your left leg behind you. At the same time, raise your right arm and straighten it in front of you. ? Do not lift your leg higher than your hip. ? Do not lift your arm higher than your shoulder. ? Keep your abdominal and back muscles tight. ? Keep your hips facing the ground. ? Do not arch your back. ? Keep your balance carefully, and do not hold your breath. 4. Hold for __________ seconds. 5. Slowly return to the starting position and repeat with your right leg and your left arm. Repeat __________ times. Complete this exercise __________ times a day. Posture and body mechanics  Body mechanics refers to the movements and positions of your body while you do your daily activities. Posture is part of body mechanics. Good posture and healthy body mechanics can help to relieve stress in your body's tissues and joints. Good posture means that your spine is in its natural S-curve position (your spine is neutral), your shoulders are pulled back slightly, and your head is not tipped forward. The following are general guidelines for applying improved posture and body mechanics to your everyday activities. Standing   When standing, keep your spine neutral and your feet about hip-width apart. Keep a slight bend in your knees. Your ears, shoulders, and hips should line up.  When you do a task in which you stand  in one place for a long time, place one foot up on a stable object that is 2-4 inches (5-10 cm) high, such as a footstool. This helps keep your spine neutral. Sitting   When sitting, keep your spine neutral and keep your feet flat on the floor. Use a footrest, if necessary, and keep your thighs parallel to the floor. Avoid rounding your shoulders, and avoid tilting your head forward.  When working at a desk or a computer, keep your desk at a height where your hands are slightly lower than your elbows. Slide your chair under your desk so you are close enough to maintain good posture.  When working at a computer, place your monitor at a height where you are looking straight ahead and you do not have to tilt your head forward or downward to look at the screen. Resting   When lying down and resting, avoid positions that are most painful for you.  If you have pain with activities such as sitting, bending, stooping, or squatting (flexion-based activities), lie in a position in which your body does not bend very much. For example, avoid curling up on your side with your arms and knees near your chest (fetal position).  If you have pain with activities such as standing for a long time or reaching with your arms (extension-based activities), lie with your spine in a neutral position and bend your knees slightly. Try the following positions: ? Lying on your side with a pillow between your knees. ? Lying on your back with a pillow under your knees. Lifting   When lifting objects, keep your feet at least shoulder-width apart and tighten your abdominal muscles.  Bend your knees and hips and keep your spine neutral. It is important to lift using the strength of your legs, not your back. Do not lock your knees straight out.  Always ask for help to lift heavy or awkward objects. This information is not intended to replace advice given to you by your health care provider. Make sure you discuss any  questions you have with your health care provider. Document Released: 09/08/2005 Document Revised: 05/15/2016 Document Reviewed: 05/25/2015 Elsevier Interactive Patient Education  2019 [employer].

## 2019-01-27 NOTE — Progress Notes (Signed)
CC: 6 month f/u hypertension.  Needs refill on lisinopril-hctz 90 day supply.  Pt c/o sciatic nerve pain in back since 01/24/2019, pain level 6/10 and tried tylenol arthritis and aleve with no relief.  No recent weight or bp's taken.  Per pt she feels her bp has been elevated this week due to the back pain.   No travel outside the [place] or [place] in the past 3 weeks.

## 2019-03-31 ENCOUNTER — Ambulatory Visit (AMBULATORY_SURGERY_CENTER): Payer: Self-pay | Admitting: *Deleted

## 2019-03-31 ENCOUNTER — Other Ambulatory Visit: Payer: Self-pay

## 2019-03-31 ENCOUNTER — Encounter: Payer: Self-pay | Admitting: Gastroenterology

## 2019-03-31 VITALS — Ht 67.0 in | Wt 245.0 lb

## 2019-03-31 DIAGNOSIS — Z1211 Encounter for screening for malignant neoplasm of colon: Secondary | ICD-10-CM

## 2019-03-31 DIAGNOSIS — Z800 Family history of malignant neoplasm of digestive organs: Secondary | ICD-10-CM

## 2019-03-31 MED ORDER — NA SULFATE-K SULFATE-MG SULF 17.5-3.13-1.6 GM/177ML PO SOLN: ORAL | 0 refills | Status: DC

## 2019-03-31 NOTE — Progress Notes (Signed)
Patient's pre-visit was done today over the phone with the patient due to COVID-19 pandemic. Name,DOB and address verified. Insurance verified. Packet of Prep instructions mailed to patient including copy of a consent form and pre-procedure patient acknowledgement form-pt is aware. Suprep $15 Coupon included. Patient understands to call us back with any questions or concerns. Patient denies any allergies to eggs or soy. Patient denies any problems with anesthesia/sedation. Patient denies any oxygen use at home. Patient denies taking any diet/weight loss medications or blood thinners. EMMI education assisgned to patient on colonoscopy, this was explained and instructions given to patient. Pt is aware that care partner will wait in the car during proceudre; if they feel like they will be too hot to wait in the car; they may wait in the lobby.  We want them to wear a mask (we do not have any that we can provide them), practice social distancing, and we will check their temperatures when they get here.  I did remind patient that their care partner needs to stay in the parking lot the entire time. Pt will wear mask into building.

## 2019-04-14 ENCOUNTER — Encounter: Payer: Self-pay | Admitting: Gastroenterology

## 2019-04-18 ENCOUNTER — Telehealth: Payer: Self-pay | Admitting: Gastroenterology

## 2019-04-18 NOTE — Telephone Encounter (Signed)

## 2019-04-19 ENCOUNTER — Encounter: Payer: Self-pay | Admitting: Gastroenterology

## 2019-04-19 ENCOUNTER — Other Ambulatory Visit: Payer: Self-pay

## 2019-04-19 ENCOUNTER — Ambulatory Visit (AMBULATORY_SURGERY_CENTER): Payer: BC Managed Care – PPO | Admitting: Gastroenterology

## 2019-04-19 VITALS — BP 127/79 | HR 84 | Temp 98.8°F | Resp 15 | Ht 67.0 in | Wt 245.0 lb

## 2019-04-19 DIAGNOSIS — D122 Benign neoplasm of ascending colon: Secondary | ICD-10-CM

## 2019-04-19 DIAGNOSIS — Z1211 Encounter for screening for malignant neoplasm of colon: Secondary | ICD-10-CM

## 2019-04-19 DIAGNOSIS — Z800 Family history of malignant neoplasm of digestive organs: Secondary | ICD-10-CM | POA: Diagnosis not present

## 2019-04-19 MED ORDER — SODIUM CHLORIDE 0.9 % IV SOLN: 500.0000 mL | Freq: Once | INTRAVENOUS | Status: DC

## 2019-04-19 NOTE — Progress Notes (Signed)
Pt's states no medical or surgical changes since previsit or office visit. Covid screening and temp done by JB. Vital signs done by CW

## 2019-04-19 NOTE — Progress Notes (Signed)
Pt Drowsy. VSS. To PACU, report to RN. No anesthetic complications noted.  

## 2019-04-19 NOTE — Op Note (Signed)
[facility] Patient Name: Kathy Barron Procedure Date: 04/19/2019 9:20 AM MRN: [id number] Endoscopist: Mallie Mussel L. Loletha Carrow , MD Age: 58 Referring MD:  Date of Birth: 1960-10-04 Gender: Female Account #: [id number] Procedure:                Colonoscopy Indications:              Screening in patient at increased risk: Colorectal                            cancer in mother 8 or older Medicines:                Monitored Anesthesia Care Procedure:                Pre-Anesthesia Assessment:                           - Prior to the procedure, a History and Physical                            was performed, and patient medications and                            allergies were reviewed. The patient's tolerance of                            previous anesthesia was also reviewed. The risks                            and benefits of the procedure and the sedation                            options and risks were discussed with the patient.                            All questions were answered, and informed consent                            was obtained. Prior Anticoagulants: The patient has                            taken no previous anticoagulant or antiplatelet                            agents. ASA Grade Assessment: II - A patient with                            mild systemic disease. After reviewing the risks                            and benefits, the patient was deemed in                            satisfactory condition to undergo the procedure.  After obtaining informed consent, the colonoscope                            was passed under direct vision. Throughout the                            procedure, the patient's blood pressure, pulse, and                            oxygen saturations were monitored continuously. The                            Colonoscope was introduced through the anus and                            advanced to the the cecum,  identified by                            appendiceal orifice and ileocecal valve. The                            colonoscopy was somewhat difficult due to a                            redundant colon. Successful completion of the                            procedure was aided by using manual pressure. The                            patient tolerated the procedure well. The quality                            of the bowel preparation was good. The ileocecal                            valve, appendiceal orifice, and rectum were                            photographed. Scope In: 9:24:51 AM Scope Out: 9:44:06 AM Scope Withdrawal Time: 0 hours 8 minutes 54 seconds  Total Procedure Duration: 0 hours 19 minutes 15 seconds  Findings:                 The perianal and digital rectal examinations were                            normal.                           A 2 mm polyp was found in the ascending colon. The                            polyp was flat. The polyp was removed with a cold  biopsy forceps. Resection and retrieval were                            complete.                           A few diverticula were found in the descending                            colon.                           The exam was otherwise without abnormality on                            direct and retroflexion views. Complications:            No immediate complications. Estimated Blood Loss:     Estimated blood loss was minimal. Impression:               - One 2 mm polyp in the ascending colon, removed                            with a cold biopsy forceps. Resected and retrieved.                           - Diverticulosis in the descending colon.                           - The examination was otherwise normal on direct                            and retroflexion views. Recommendation:           - Patient has a contact number available for                            emergencies. The signs  and symptoms of potential                            delayed complications were discussed with the                            patient. Return to normal activities tomorrow.                            Written discharge instructions were provided to the                            patient.                           - Resume previous diet.                           - Continue present medications.                           -  Await pathology results.                           - Repeat colonoscopy in 5 years for screening                            purposes. Henry L. Loletha Carrow, MD 04/19/2019 9:47:55 AM This report has been signed electronically.

## 2019-04-19 NOTE — Patient Instructions (Signed)
HANDOUT GIVEN : POLYPS   YOU HAD AN ENDOSCOPIC PROCEDURE TODAY AT [facility]:   Refer to the procedure report that was given to you for any specific questions about what was found during the examination.  If the procedure report does not answer your questions, please call your gastroenterologist to clarify.  If you requested that your care partner not be given the details of your procedure findings, then the procedure report has been included in a sealed envelope for you to review at your convenience later.  YOU SHOULD EXPECT: Some feelings of bloating in the abdomen. Passage of more gas than usual.  Walking can help get rid of the air that was put into your GI tract during the procedure and reduce the bloating. If you had a lower endoscopy (such as a colonoscopy or flexible sigmoidoscopy) you may notice spotting of blood in your stool or on the toilet paper. If you underwent a bowel prep for your procedure, you may not have a normal bowel movement for a few days.  Please Note:  You might notice some irritation and congestion in your nose or some drainage.  This is from the oxygen used during your procedure.  There is no need for concern and it should clear up in a day or so.  SYMPTOMS TO REPORT IMMEDIATELY:   Following lower endoscopy (colonoscopy or flexible sigmoidoscopy):  Excessive amounts of blood in the stool  Significant tenderness or worsening of abdominal pains  Swelling of the abdomen that is new, acute  Fever of 100F or higher   For urgent or emergent issues, a gastroenterologist can be reached at any hour by calling [phone].   DIET:  We do recommend a small meal at first, but then you may proceed to your regular diet.  Drink plenty of fluids but you should avoid alcoholic beverages for 24 hours.  ACTIVITY:  You should plan to take it easy for the rest of today and you should NOT DRIVE or use heavy machinery until tomorrow (because of the sedation  medicines used during the test).    FOLLOW UP: Our staff will call the number listed on your records 48-72 hours following your procedure to check on you and address any questions or concerns that you may have regarding the information given to you following your procedure. If we do not reach you, we will leave a message.  We will attempt to reach you two times.  During this call, we will ask if you have developed any symptoms of COVID 19. If you develop any symptoms (ie: fever, flu-like symptoms, shortness of breath, cough etc.) before then, please call [phone].  If you test positive for Covid 19 in the 2 weeks post procedure, please call and report this information to [place].    If any biopsies were taken you will be contacted by phone or by letter within the next 1-3 weeks.  Please call us at [phone] if you have not heard about the biopsies in 3 weeks.    SIGNATURES/CONFIDENTIALITY: You and/or your care partner have signed paperwork which will be entered into your electronic medical record.  These signatures attest to the fact that that the information above on your After Visit Summary has been reviewed and is understood.  Full responsibility of the confidentiality of this discharge information lies with you and/or your care-partner.

## 2019-04-21 ENCOUNTER — Telehealth: Payer: Self-pay

## 2019-04-21 NOTE — Telephone Encounter (Signed)
  Follow up Call-  Call back number 04/19/2019  Post procedure Call Back phone  # [phone]  Permission to leave phone message Yes  Some recent data might be hidden     Patient questions:  Do you have a fever, pain , or abdominal swelling? No. Pain Score  0 *  Have you tolerated food without any problems? Yes.    Have you been able to return to your normal activities? Yes.    Do you have any questions about your discharge instructions: Diet   No. Medications  No. Follow up visit  No.  Do you have questions or concerns about your Care? No.  Actions: * If pain score is 4 or above: 1. No action needed, pain <4.Have you developed a fever since your procedure? no  2.   Have you had an respiratory symptoms (SOB or cough) since your procedure? no  3.   Have you tested positive for COVID 19 since your procedure no  4.   Have you had any family members/close contacts diagnosed with the COVID 19 since your procedure?  no   If yes to any of these questions please route to Joylene John, RN and Alphonsa Gin, Therapist, sports.

## 2019-04-21 NOTE — Telephone Encounter (Signed)
No answer, left message to call back later, B.Judee Hennick RN. 

## 2019-04-25 ENCOUNTER — Encounter: Payer: Self-pay | Admitting: Gastroenterology

## 2019-07-26 LAB — HM MAMMOGRAPHY

## 2019-07-26 LAB — HM PAP SMEAR: HM Pap smear: NEGATIVE

## 2019-08-01 ENCOUNTER — Ambulatory Visit: Payer: BC Managed Care – PPO | Admitting: Family Medicine

## 2019-08-01 ENCOUNTER — Ambulatory Visit (INDEPENDENT_AMBULATORY_CARE_PROVIDER_SITE_OTHER): Payer: BC Managed Care – PPO

## 2019-08-01 ENCOUNTER — Encounter: Payer: Self-pay | Admitting: Family Medicine

## 2019-08-01 ENCOUNTER — Other Ambulatory Visit: Payer: Self-pay

## 2019-08-01 VITALS — BP 140/80 | HR 77 | Temp 98.7°F | Ht 67.0 in | Wt 252.8 lb

## 2019-08-01 DIAGNOSIS — M25561 Pain in right knee: Secondary | ICD-10-CM | POA: Diagnosis not present

## 2019-08-01 DIAGNOSIS — Z23 Encounter for immunization: Secondary | ICD-10-CM

## 2019-08-01 MED ORDER — NAPROXEN 500 MG PO TBEC: 500.0000 mg | DELAYED_RELEASE_TABLET | Freq: Two times a day (BID) | ORAL | 0 refills | Status: DC

## 2019-08-01 NOTE — Progress Notes (Signed)
11/9/20201:38 PM  Kathy Barron Jan 06, 1961, 59 y.o., female [id number]  Chief Complaint  Patient presents with  . Fall    at work. did not file claim   . Right knee pain    stiftness Happened Friday     HPI:   Patient is a 58 y.o. female  who presents today for right knee pain  While at work, she feel onto her fours Since then started having right knee pain Did not swell immediately but then started having swelling, middle side pain, difficulty with bending, going up/down stairs, no locking/giving out Has been doing ice packs, icy hot, aleve, brace Today better, using cane Able to park close at work, able to work from home No h/o serious injuries  Depression screen [facility] 2/9 08/01/2019 01/27/2019 07/29/2018  Decreased Interest 0 0 0  Down, Depressed, Hopeless 0 0 0  PHQ - 2 Score 0 0 0    Fall Risk  08/01/2019 01/27/2019 07/29/2018 07/27/2017 09/03/2016  Falls in the past year? 1 0 0 No No  Number falls in past yr: 0 0 - - -  Injury with Fall? 1 0 - - -  Risk for fall due to : History of fall(s) - - - -  Follow up Falls evaluation completed Falls evaluation completed - - -     No Known Allergies  Prior to Admission medications   Medication Sig Start Date End Date Taking? Authorizing Provider  lisinopril-hydrochlorothiazide (ZESTORETIC) 20-12.5 MG tablet Take 1 tablet by mouth 2 (two) times daily. 01/27/19  Yes Delia Chimes A, MD  gabapentin (NEURONTIN) 300 MG capsule Take 1 capsule (300 mg total) by mouth 2 (two) times daily. Patient not taking: Reported on 03/31/2019 01/27/19   Forrest Moron, MD    Past Medical History:  Diagnosis Date  . Hypertension     Past Surgical History:  Procedure Laterality Date  . CHOLECYSTECTOMY  2009  . TUBAL LIGATION      Social History   Tobacco Use  . Smoking status: Never Smoker  . Smokeless tobacco: Never Used  Substance Use Topics  . Alcohol use: No    Frequency: Never    Family History  Problem Relation Age of Onset  .  Hyperlipidemia Mother   . Colon cancer Mother 70  . Cancer Father   . Esophageal cancer Neg Hx   . Rectal cancer Neg Hx   . Stomach cancer Neg Hx   . Colon polyps Neg Hx     ROS   OBJECTIVE:  Today's Vitals   08/01/19 1333 08/01/19 1405  BP: (!) 156/76 140/80  Pulse: 77   Temp: 98.7 F (37.1 C)   TempSrc: Oral   SpO2: 96%   Weight: 252 lb 12.8 oz (114.7 kg)   Height: 5\' 7"  (1.702 m)    Body mass index is 39.59 kg/m.   Physical Exam Vitals signs and nursing note reviewed.  Constitutional:      Appearance: She is well-developed.  HENT:     Head: Normocephalic and atraumatic.  Eyes:     General: No scleral icterus.    Conjunctiva/sclera: Conjunctivae normal.     Pupils: Pupils are equal, round, and reactive to light.  Neck:     Musculoskeletal: Neck supple.  Pulmonary:     Effort: Pulmonary effort is normal.  Musculoskeletal:     Right knee: She exhibits decreased range of motion and swelling. She exhibits no effusion, no ecchymosis, no erythema, no LCL laxity, normal patellar mobility,  normal meniscus and no MCL laxity. Tenderness (along medial aspect of knee, nonfocal) found.     Left knee: Normal.  Skin:    General: Skin is warm and dry.  Neurological:     Mental Status: She is alert and oriented to person, place, and time.     No results found for this or any previous visit (from the past 24 hour(s)).  Dg Knee Complete 4 Views Right  Result Date: 08/01/2019 CLINICAL DATA:  Right knee pain and swelling after a fall. EXAM: RIGHT KNEE - COMPLETE 4+ VIEW COMPARISON:  None. FINDINGS: There is no fracture or dislocation. There appears to be a joint effusion. 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 Small osteophytes in the periphery of the medial compartment. IMPRESSION: No acute bone abnormality. Joint effusion. 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 Electronically Signed   By: Lorriane Shire M.D.   On: 08/01/2019 14:04      ASSESSMENT and PLAN  1. Right medial knee pain Cont with rice therapy. RTC precautions given.  - DG Knee Complete 4 Views Right; Future  2. Need for immunization against influenza - Flu Vaccine QUAD 36+ mos IM  Other orders - naproxen (EC NAPROSYN) 500 MG EC tablet; Take 1 tablet (500 mg total) by mouth 2 (two) times daily with a meal.  Return if symptoms worsen or fail to improve.    Rutherford Guys, MD Primary [facility] at [place], [place] [address] Ph.  [phone] Fax [phone]

## 2019-08-01 NOTE — Patient Instructions (Addendum)
Acute Knee Pain, Adult Many things can cause knee pain. Sometimes, knee pain is sudden (acute) and may be caused by damage, swelling, or irritation of the muscles and tissues that support your knee. The pain often goes away on its own with time and rest. If the pain does not go away, tests may be done to find out what is causing the pain. Follow these instructions at home: Pay attention to any changes in your symptoms. Take these actions to relieve your pain. If you have a knee sleeve or brace:   Wear the sleeve or brace as told by your doctor. Remove it only as told by your doctor.  Loosen the sleeve or brace if your toes: ? Tingle. ? Become numb. ? Turn cold and blue.  Keep the sleeve or brace clean.  If the sleeve or brace is not waterproof: ? Do not let it get wet. ? Cover it with a watertight covering when you take a bath or shower. Activity  Rest your knee.  Do not do things that cause pain.  Avoid activities where both feet leave the ground at the same time (high-impact activities). Examples are running, jumping rope, and doing jumping jacks.  Work with a physical therapist to make a safe exercise program, as told by your doctor. Managing pain, stiffness, and swelling   If told, put ice on the knee: ? Put ice in a plastic bag. ? Place a towel between your skin and the bag. ? Leave the ice on for 20 minutes, 2-3 times a day.  If told, put pressure (compression) on your injured knee to control swelling, give support, and help with discomfort. Compression may be done with an elastic bandage. General instructions  Take all medicines only as told by your doctor.  Raise (elevate) your knee while you are sitting or lying down. Make sure your knee is higher than your heart.  Sleep with a pillow under your knee.  Do not use any products that contain nicotine or tobacco. These include cigarettes, e-cigarettes, and chewing tobacco. These products may slow down healing.  If you need help quitting, ask your doctor.  If you are overweight, work with your doctor and a food expert (dietitian) to set goals to lose weight. Being overweight can make your knee hurt more.  Keep all follow-up visits as told by your doctor. This is important. Contact a doctor if:  The knee pain does not stop.  The knee pain changes or gets worse.  You have a fever along with knee pain.  Your knee feels warm when you touch it.  Your knee gives out or locks up. Get help right away if:  Your knee swells, and the swelling gets worse.  You cannot move your knee.  You have very bad knee pain. Summary  Many things can cause knee pain. The pain often goes away on its own with time and rest.  Your doctor may do tests to find out the cause of the pain.  Pay attention to any changes in your symptoms. Relieve your pain with rest, medicines, light activity, and use of ice.  Get help right away if you cannot move your knee or your knee pain is very bad. This information is not intended to replace advice given to you by your health care provider. Make sure you discuss any questions you have with your health care provider. Document Released: 12/05/2008 Document Revised: 02/18/2018 Document Reviewed: 02/18/2018 Elsevier Patient Education  2020 [employer].  If you have lab work done today you will be contacted with your lab results within the next 2 weeks.  If you have not heard from [place] then please contact us. The fastest way to get your results is to register for My Chart.   IF you received an x-ray today, you will receive an invoice from [facility]. Please contact [facility] at [phone] with questions or concerns regarding your invoice.   IF you received labwork today, you will receive an invoice from [place]. Please contact [facility] at [phone] with questions or concerns regarding your invoice.   Our billing staff will not be able to assist you with  questions regarding bills from these companies.  You will be contacted with the lab results as soon as they are available. The fastest way to get your results is to activate your My Chart account. Instructions are located on the last page of this paperwork. If you have not heard from [place] regarding the results in 2 weeks, please contact this office.

## 2019-11-14 ENCOUNTER — Encounter: Payer: Self-pay | Admitting: Registered Nurse

## 2019-11-14 ENCOUNTER — Ambulatory Visit: Payer: BC Managed Care – PPO | Admitting: Registered Nurse

## 2019-11-14 ENCOUNTER — Other Ambulatory Visit: Payer: Self-pay

## 2019-11-14 VITALS — BP 142/86 | HR 94 | Temp 97.2°F | Ht 67.0 in | Wt 261.4 lb

## 2019-11-14 DIAGNOSIS — R350 Frequency of micturition: Secondary | ICD-10-CM

## 2019-11-14 DIAGNOSIS — I10 Essential (primary) hypertension: Secondary | ICD-10-CM

## 2019-11-14 DIAGNOSIS — N3001 Acute cystitis with hematuria: Secondary | ICD-10-CM | POA: Diagnosis not present

## 2019-11-14 LAB — POCT URINALYSIS DIP (MANUAL ENTRY)
Bilirubin, UA: NEGATIVE
Glucose, UA: NEGATIVE mg/dL
Ketones, POC UA: NEGATIVE mg/dL
Nitrite, UA: NEGATIVE
Protein Ur, POC: NEGATIVE mg/dL
Spec Grav, UA: 1.01 (ref 1.010–1.025)
Urobilinogen, UA: 0.2 E.U./dL
pH, UA: 6 (ref 5.0–8.0)

## 2019-11-14 MED ORDER — LISINOPRIL-HYDROCHLOROTHIAZIDE 20-12.5 MG PO TABS: 1.0000 | ORAL_TABLET | Freq: Two times a day (BID) | ORAL | 0 refills | Status: DC

## 2019-11-14 MED ORDER — SULFAMETHOXAZOLE-TRIMETHOPRIM 400-80 MG PO TABS: 1.0000 | ORAL_TABLET | Freq: Two times a day (BID) | ORAL | 0 refills | Status: DC

## 2019-11-14 NOTE — Patient Instructions (Signed)
° ° ° °  If you have lab work done today you will be contacted with your lab results within the next 2 weeks.  If you have not heard from us then please contact us. The fastest way to get your results is to register for My Chart. ° ° °IF you received an x-ray today, you will receive an invoice from [facility]. Please contact [facility] at [phone] with questions or concerns regarding your invoice.  ° °IF you received labwork today, you will receive an invoice from [facility]. Please contact [facility] at [phone] with questions or concerns regarding your invoice.  ° °Our billing staff will not be able to assist you with questions regarding bills from these companies. ° °You will be contacted with the lab results as soon as they are available. The fastest way to get your results is to activate your My Chart account. Instructions are located on the last page of this paperwork. If you have not heard from us regarding the results in 2 weeks, please contact this office. °  ° ° ° °

## 2019-11-14 NOTE — Progress Notes (Signed)
Acute Office Visit  Subjective:    Patient ID: Kathy Barron, female    DOB: 1960-12-25, 59 y.o.   MRN: [id number]  Chief Complaint  Patient presents with  . Urinary Frequency    patient states she had some spotting , and then it turned into frequent urniation and some pain on the left side.     HPI Patient is in today for UTI - has had one before, but has been some time. Some spotting, then frequent urination, now L flank pain Denies fevers, chills, fatigue, systemic symptoms. Denies vaginal symptoms  Past Medical History:  Diagnosis Date  . Hypertension     Past Surgical History:  Procedure Laterality Date  . CHOLECYSTECTOMY  2009  . TUBAL LIGATION      Family History  Problem Relation Age of Onset  . Hyperlipidemia Mother   . Colon cancer Mother 5  . Cancer Father   . Esophageal cancer Neg Hx   . Rectal cancer Neg Hx   . Stomach cancer Neg Hx   . Colon polyps Neg Hx     Social History   Socioeconomic History  . Marital status: Single    Spouse name: Not on file  . Number of children: Not on file  . Years of education: Not on file  . Highest education level: Not on file  Occupational History  . Not on file  Tobacco Use  . Smoking status: Never Smoker  . Smokeless tobacco: Never Used  Substance and Sexual Activity  . Alcohol use: No  . Drug use: No  . Sexual activity: Not on file  Other Topics Concern  . Not on file  Social History Narrative  . Not on file   Social Determinants of Health   Financial Resource Strain:   . Difficulty of Paying Living Expenses: Not on file  Food Insecurity:   . Worried About Charity fundraiser in the Last Year: Not on file  . Ran Out of Food in the Last Year: Not on file  Transportation Needs:   . Lack of Transportation (Medical): Not on file  . Lack of Transportation (Non-Medical): Not on file  Physical Activity:   . Days of Exercise per Week: Not on file  . Minutes of Exercise per Session: Not on file    Stress:   . Feeling of Stress : Not on file  Social Connections:   . Frequency of Communication with Friends and Family: Not on file  . Frequency of Social Gatherings with Friends and Family: Not on file  . Attends Religious Services: Not on file  . Active Member of Clubs or Organizations: Not on file  . Attends Archivist Meetings: Not on file  . Marital Status: Not on file  Intimate Partner Violence:   . Fear of Current or Ex-Partner: Not on file  . Emotionally Abused: Not on file  . Physically Abused: Not on file  . Sexually Abused: Not on file    Outpatient Medications Prior to Visit  Medication Sig Dispense Refill  . lisinopril-hydrochlorothiazide (ZESTORETIC) 20-12.5 MG tablet Take 1 tablet by mouth 2 (two) times daily. 180 tablet 1  . gabapentin (NEURONTIN) 300 MG capsule Take 1 capsule (300 mg total) by mouth 2 (two) times daily. (Patient not taking: Reported on 03/31/2019) 60 capsule 1  . naproxen (EC NAPROSYN) 500 MG EC tablet Take 1 tablet (500 mg total) by mouth 2 (two) times daily with a meal. (Patient not taking: Reported on 11/14/2019)  30 tablet 0   No facility-administered medications prior to visit.    No Known Allergies  Review of Systems  Constitutional: Negative.   HENT: Negative.   Eyes: Negative.   Respiratory: Negative.   Cardiovascular: Negative.   Gastrointestinal: Negative.   Endocrine: Negative.   Genitourinary: Positive for dysuria, flank pain and frequency. Negative for decreased urine volume, difficulty urinating, dyspareunia, enuresis, genital sores, hematuria, menstrual problem, pelvic pain, urgency, vaginal bleeding, vaginal discharge and vaginal pain.  Musculoskeletal: Positive for back pain (flank). Negative for arthralgias, gait problem, joint swelling, myalgias, neck pain and neck stiffness.  Skin: Negative.   Allergic/Immunologic: Negative.   Neurological: Negative.   Hematological: Negative.   Psychiatric/Behavioral: Negative.    All other systems reviewed and are negative.      Objective:    Physical Exam Vitals and nursing note reviewed.  Constitutional:      General: She is not in acute distress.    Appearance: Normal appearance. She is normal weight. She is not ill-appearing, toxic-appearing or diaphoretic.  HENT:     Head: Normocephalic and atraumatic.  Cardiovascular:     Rate and Rhythm: Normal rate and regular rhythm.  Pulmonary:     Effort: Pulmonary effort is normal. No respiratory distress.  Musculoskeletal:        General: Tenderness (L CVA) present.  Skin:    General: Skin is warm and dry.     Coloration: Skin is not jaundiced or pale.     Findings: No bruising, erythema, lesion or rash.  Neurological:     General: No focal deficit present.     Mental Status: She is alert and oriented to person, place, and time. Mental status is at baseline.  Psychiatric:        Mood and Affect: Mood normal.        Behavior: Behavior normal.        Thought Content: Thought content normal.        Judgment: Judgment normal.     BP (!) 142/86   Pulse 94   Temp (!) 97.2 F (36.2 C) (Temporal)   Ht 5\' 7"  (1.702 m)   Wt 261 lb 6.4 oz (118.6 kg)   SpO2 98%   BMI 40.94 kg/m  Wt Readings from Last 3 Encounters:  11/14/19 261 lb 6.4 oz (118.6 kg)  08/01/19 252 lb 12.8 oz (114.7 kg)  04/19/19 245 lb (111.1 kg)    Health Maintenance Due  Topic Date Due  . Hepatitis C Screening  08-03-1961  . HIV Screening  01/30/1975    There are no preventive care reminders to display for this patient.   Lab Results  Component Value Date   TSH 1.24 07/23/2016   Lab Results  Component Value Date   WBC 9.8 07/03/2015   HGB 11.9 (A) 07/03/2015   HCT 37.6 (A) 07/03/2015   MCV 76.0 (A) 07/03/2015   PLT 348 07/20/2013   Lab Results  Component Value Date   NA 140 07/29/2018   K 4.1 07/29/2018   CO2 26 07/29/2018   GLUCOSE 78 07/29/2018   BUN 11 07/29/2018   CREATININE 0.81 07/29/2018   BILITOT 0.7  07/29/2018   ALKPHOS 85 07/29/2018   AST 19 07/29/2018   ALT 16 07/29/2018   PROT 7.3 07/29/2018   ALBUMIN 4.3 07/29/2018   CALCIUM 9.6 07/29/2018   Lab Results  Component Value Date   CHOL 210 (H) 07/29/2018   Lab Results  Component Value Date   HDL  58 07/29/2018   Lab Results  Component Value Date   LDLCALC 133 (H) 07/29/2018   Lab Results  Component Value Date   TRIG 97 07/29/2018   Lab Results  Component Value Date   CHOLHDL 3.6 07/29/2018   Lab Results  Component Value Date   HGBA1C 5.2 07/29/2018       Assessment & Plan:   Problem List Items Addressed This Visit      Cardiovascular and Mediastinum   Essential hypertension   Relevant Medications   lisinopril-hydrochlorothiazide (ZESTORETIC) 20-12.5 MG tablet    Other Visit Diagnoses    Frequent urination    -  Primary   Relevant Medications   sulfamethoxazole-trimethoprim (BACTRIM) 400-80 MG tablet   Other Relevant Orders   POCT urinalysis dipstick (Completed)   Acute cystitis with hematuria       Relevant Medications   sulfamethoxazole-trimethoprim (BACTRIM) 400-80 MG tablet       Meds ordered this encounter  Medications  . lisinopril-hydrochlorothiazide (ZESTORETIC) 20-12.5 MG tablet    Sig: Take 1 tablet by mouth 2 (two) times daily.    Dispense:  90 tablet    Refill:  0    Order Specific Question:   Supervising Provider    Answer:   Delia Chimes A [id number]  . sulfamethoxazole-trimethoprim (BACTRIM) 400-80 MG tablet    Sig: Take 1 tablet by mouth 2 (two) times daily.    Dispense:  6 tablet    Refill:  0    Order Specific Question:   Supervising Provider    Answer:   Forrest Moron [id number]   PLAN  Dip shows RBCs and leuks, will treat as UTI  Bactrim po bid for three days  Refill lisinopril-hctz, patient due for appt with Nolon Rod, her pcp.  Patient encouraged to call clinic with any questions, comments, or concerns.   Maximiano Coss, NP

## 2019-11-21 ENCOUNTER — Telehealth (INDEPENDENT_AMBULATORY_CARE_PROVIDER_SITE_OTHER): Payer: BC Managed Care – PPO | Admitting: Family Medicine

## 2019-11-21 ENCOUNTER — Encounter: Payer: Self-pay | Admitting: Family Medicine

## 2019-11-21 ENCOUNTER — Other Ambulatory Visit: Payer: Self-pay

## 2019-11-21 DIAGNOSIS — R300 Dysuria: Secondary | ICD-10-CM | POA: Diagnosis not present

## 2019-11-21 DIAGNOSIS — N3001 Acute cystitis with hematuria: Secondary | ICD-10-CM

## 2019-11-21 LAB — POCT URINALYSIS DIP (MANUAL ENTRY)
Bilirubin, UA: NEGATIVE
Glucose, UA: NEGATIVE mg/dL
Ketones, POC UA: NEGATIVE mg/dL
Leukocytes, UA: NEGATIVE
Nitrite, UA: NEGATIVE
Spec Grav, UA: 1.025 (ref 1.010–1.025)
Urobilinogen, UA: 1 E.U./dL
pH, UA: 6.5 (ref 5.0–8.0)

## 2019-11-21 LAB — POC MICROSCOPIC URINALYSIS (UMFC): Mucus: ABSENT

## 2019-11-21 MED ORDER — NITROFURANTOIN MONOHYD MACRO 100 MG PO CAPS: 100.0000 mg | ORAL_CAPSULE | Freq: Two times a day (BID) | ORAL | 0 refills | Status: AC

## 2019-11-21 NOTE — Progress Notes (Signed)
DOXIMITY VIDEO Encounter- SOAP NOTE Established Patient  This VIDEO encounter was conducted with the patient's (or proxy's) verbal consent via audio telecommunications: yes/no: Yes Patient was instructed to have this encounter in a suitably private space; and to only have persons present to whom they give permission to participate. In addition, patient identity was confirmed by use of name plus two identifiers (DOB and address).  I discussed the limitations, risks, security and privacy concerns of performing an evaluation and management service by telephone and the availability of in person appointments. I also discussed with the patient that there may be a patient responsible charge related to this service. The patient expressed understanding and agreed to proceed.  I spent a total of TIME; 0 MIN TO 60 MIN: 15 minutes talking with the patient or their proxy.  Chief Complaint  Patient presents with  . follow up uti    per pt the symptoms are the same.  pt has finished bactrim.      Subjective   Kathy Barron is a 59 y.o. established patient. Telephone visit today for  HPI  Patient reports that she completed antibiotics and has some flank pain No dysuria but feels like a pressure with urinating She also continues to have blood in the urine. She feels like something feels like something has let loose in her bladder The blood in the urine resolved on abx but it came back yesterday that were a few small clots She denies fevers or chills She states that her symptoms are better today.  Patient Active Problem List   Diagnosis Date Noted  . Uterine leiomyoma 07/29/2018  . Class 2 obesity due to excess calories without serious comorbidity with body mass index (BMI) of 37.0 to 37.9 in adult 07/27/2017  . Essential hypertension 07/23/2016    Past Medical History:  Diagnosis Date  . Hypertension     Current Outpatient Medications  Medication Sig Dispense Refill  .  lisinopril-hydrochlorothiazide (ZESTORETIC) 20-12.5 MG tablet Take 1 tablet by mouth 2 (two) times daily. 90 tablet 0  . gabapentin (NEURONTIN) 300 MG capsule Take 1 capsule (300 mg total) by mouth 2 (two) times daily. (Patient not taking: Reported on 03/31/2019) 60 capsule 1  . naproxen (EC NAPROSYN) 500 MG EC tablet Take 1 tablet (500 mg total) by mouth 2 (two) times daily with a meal. (Patient not taking: Reported on 11/14/2019) 30 tablet 0  . nitrofurantoin, macrocrystal-monohydrate, (MACROBID) 100 MG capsule Take 1 capsule (100 mg total) by mouth 2 (two) times daily for 7 days. 14 capsule 0  . sulfamethoxazole-trimethoprim (BACTRIM) 400-80 MG tablet Take 1 tablet by mouth 2 (two) times daily. (Patient not taking: Reported on 11/21/2019) 6 tablet 0   No current facility-administered medications for this visit.    No Known Allergies  Social History   Socioeconomic History  . Marital status: Single    Spouse name: Not on file  . Number of children: Not on file  . Years of education: Not on file  . Highest education level: Not on file  Occupational History  . Not on file  Tobacco Use  . Smoking status: Never Smoker  . Smokeless tobacco: Never Used  Substance and Sexual Activity  . Alcohol use: No  . Drug use: No  . Sexual activity: Not on file  Other Topics Concern  . Not on file  Social History Narrative  . Not on file   Social Determinants of Health   Financial Resource Strain:   .  Difficulty of Paying Living Expenses: Not on file  Food Insecurity:   . Worried About Charity fundraiser in the Last Year: Not on file  . Ran Out of Food in the Last Year: Not on file  Transportation Needs:   . Lack of Transportation (Medical): Not on file  . Lack of Transportation (Non-Medical): Not on file  Physical Activity:   . Days of Exercise per Week: Not on file  . Minutes of Exercise per Session: Not on file  Stress:   . Feeling of Stress : Not on file  Social Connections:   .  Frequency of Communication with Friends and Family: Not on file  . Frequency of Social Gatherings with Friends and Family: Not on file  . Attends Religious Services: Not on file  . Active Member of Clubs or Organizations: Not on file  . Attends Archivist Meetings: Not on file  . Marital Status: Not on file  Intimate Partner Violence:   . Fear of Current or Ex-Partner: Not on file  . Emotionally Abused: Not on file  . Physically Abused: Not on file  . Sexually Abused: Not on file    ROS See hpi Objective   Vitals as reported by the patient: There were no vitals filed for this visit.  Physical Exam  Constitutional: Oriented to person, place, and time. Appears well-developed and well-nourished.  HENT:  Head: Normocephalic and atraumatic.  Eyes: Conjunctivae and EOM are normal.  Pulmonary/Chest: Effort normal and no distress Neurological: Is alert and oriented to person, place, and time.  Skin: Skin is warm. Capillary refill takes less than 2 seconds.  Psychiatric: Has a normal mood and affect. Behavior is normal. Judgment and thought content normal.    Alle was seen today for follow up uti.  Diagnoses and all orders for this visit:  Dysuria -     Urine Culture -     nitrofurantoin, macrocrystal-monohydrate, (MACROBID) 100 MG capsule; Take 1 capsule (100 mg total) by mouth 2 (two) times daily for 7 days. -     POCT Microscopic Urinalysis ([facility]) -     POCT urinalysis dipstick  Hematuria due to acute cystitis -     nitrofurantoin, macrocrystal-monohydrate, (MACROBID) 100 MG capsule; Take 1 capsule (100 mg total) by mouth 2 (two) times daily for 7 days. -     POCT Microscopic Urinalysis ([facility]) -     POCT urinalysis dipstick   Advised pt to present to clinic to get her urine check Will use bacteriostatic antibiotic macrobid while awaiting culture Would also advise pt to use cranberry extract AZO    I discussed the assessment and treatment plan with the  patient. The patient was provided an opportunity to ask questions and all were answered. The patient agreed with the plan and demonstrated an understanding of the instructions.   The patient was advised to call back or seek an in-person evaluation if the symptoms worsen or if the condition fails to improve as anticipated.  I provided 15 minutes of DOXIMITY face-to-face time during this encounter.  Forrest Moron, MD  Primary Care at [facility]

## 2019-11-21 NOTE — Patient Instructions (Addendum)
Please use Azo over the counter Continue hydration with water to flush the bladder Take the antibiotic nitrofurantoin while we await the culture results.   If you have lab work done today you will be contacted with your lab results within the next 2 weeks.  If you have not heard from [place] then please contact us. The fastest way to get your results is to register for My Chart.   IF you received an x-ray today, you will receive an invoice from [facility]. Please contact [facility] at [phone] with questions or concerns regarding your invoice.   IF you received labwork today, you will receive an invoice from [facility]. Please contact [facility] at [phone] with questions or concerns regarding your invoice.   Our billing staff will not be able to assist you with questions regarding bills from these companies.  You will be contacted with the lab results as soon as they are available. The fastest way to get your results is to activate your My Chart account. Instructions are located on the last page of this paperwork. If you have not heard from [place] regarding the results in 2 weeks, please contact this office.

## 2019-11-22 LAB — URINE CULTURE

## 2019-11-23 ENCOUNTER — Telehealth: Payer: Self-pay | Admitting: Family Medicine

## 2019-11-23 NOTE — Telephone Encounter (Signed)
Pt would like a call back in regards to what are the next steps from the ear analysis  She said that she could see it in mychart   Please advise

## 2019-11-24 ENCOUNTER — Encounter: Payer: Self-pay | Admitting: Family Medicine

## 2019-11-25 NOTE — Telephone Encounter (Signed)
Please Advise

## 2019-12-01 ENCOUNTER — Ambulatory Visit: Payer: BC Managed Care – PPO | Attending: Family

## 2019-12-01 DIAGNOSIS — Z23 Encounter for immunization: Secondary | ICD-10-CM

## 2019-12-01 NOTE — Progress Notes (Signed)
   Covid-19 Vaccination Clinic  Name:  Kathy Barron    MRN: [id number] DOB: 11/03/1960  12/01/2019  [name] was observed post Covid-19 immunization for 15 minutes without incident. She was provided with Vaccine Information Sheet and instruction to access the V-Safe system.   [name] was instructed to call 911 with any severe reactions post vaccine: Marland Kitchen Difficulty breathing  . Swelling of face and throat  . A fast heartbeat  . A bad rash all over body  . Dizziness and weakness   Immunizations Administered    Name Date Dose VIS Date Route   Moderna COVID-19 Vaccine 12/01/2019  2:42 PM 0.5 mL 08/23/2019 Intramuscular   Manufacturer: Moderna   Lot: [id number]   [id number]

## 2019-12-06 NOTE — Telephone Encounter (Signed)
Dr. Ouida Sills, OB-GYN, completed ultrasound with biopsy.  The pathology report was benign.

## 2019-12-26 ENCOUNTER — Ambulatory Visit: Payer: BC Managed Care – PPO | Admitting: Family Medicine

## 2020-01-03 ENCOUNTER — Ambulatory Visit: Payer: BC Managed Care – PPO | Attending: Family

## 2020-01-03 DIAGNOSIS — Z23 Encounter for immunization: Secondary | ICD-10-CM

## 2020-01-03 NOTE — Progress Notes (Signed)
   Covid-19 Vaccination Clinic  Name:  Kathy Barron    MRN: [id number] DOB: 1961/07/10  01/03/2020  [name] was observed post Covid-19 immunization for 15 minutes without incident. She was provided with Vaccine Information Sheet and instruction to access the V-Safe system.   [name] was instructed to call 911 with any severe reactions post vaccine: Marland Kitchen Difficulty breathing  . Swelling of face and throat  . A fast heartbeat  . A bad rash all over body  . Dizziness and weakness   Immunizations Administered    Name Date Dose VIS Date Route   Moderna COVID-19 Vaccine 01/03/2020  2:43 PM 0.5 mL 08/23/2019 Intramuscular   Manufacturer: Moderna   Lot: [id number]   [id number]

## 2020-04-06 ENCOUNTER — Other Ambulatory Visit: Payer: Self-pay

## 2020-04-06 ENCOUNTER — Encounter: Payer: Self-pay | Admitting: Family Medicine

## 2020-04-06 ENCOUNTER — Ambulatory Visit: Payer: BC Managed Care – PPO | Admitting: Family Medicine

## 2020-04-06 VITALS — BP 178/93 | HR 74 | Temp 98.4°F | Ht 67.0 in | Wt 267.0 lb

## 2020-04-06 DIAGNOSIS — Z131 Encounter for screening for diabetes mellitus: Secondary | ICD-10-CM | POA: Diagnosis not present

## 2020-04-06 DIAGNOSIS — I10 Essential (primary) hypertension: Secondary | ICD-10-CM

## 2020-04-06 DIAGNOSIS — Z1329 Encounter for screening for other suspected endocrine disorder: Secondary | ICD-10-CM

## 2020-04-06 DIAGNOSIS — Z6841 Body Mass Index (BMI) 40.0 and over, adult: Secondary | ICD-10-CM | POA: Diagnosis not present

## 2020-04-06 DIAGNOSIS — E785 Hyperlipidemia, unspecified: Secondary | ICD-10-CM

## 2020-04-06 MED ORDER — AMLODIPINE BESYLATE 2.5 MG PO TABS: 2.5000 mg | ORAL_TABLET | Freq: Every day | ORAL | 1 refills | Status: DC

## 2020-04-06 MED ORDER — LISINOPRIL-HYDROCHLOROTHIAZIDE 20-12.5 MG PO TABS: 1.0000 | ORAL_TABLET | Freq: Two times a day (BID) | ORAL | 1 refills | Status: DC

## 2020-04-06 NOTE — Patient Instructions (Addendum)
  Try to eat some form of breakfast daily. Continue to try to prepare most of your food, avoid fast food as much as possible. Keep up the good work with exercise!  Add amlodipine once per day. Continue other blood pressure med same dose.   I will check some labs today. Follow up in 3 months, but let me know if any questions sooner. Thanks for coming in today.   If you have lab work done today you will be contacted with your lab results within the next 2 weeks.  If you have not heard from [place] then please contact us. The fastest way to get your results is to register for My Chart.   IF you received an x-ray today, you will receive an invoice from [facility]. Please contact [facility] at [phone] with questions or concerns regarding your invoice.   IF you received labwork today, you will receive an invoice from [place]. Please contact [facility] at [phone] with questions or concerns regarding your invoice.   Our billing staff will not be able to assist you with questions regarding bills from these companies.  You will be contacted with the lab results as soon as they are available. The fastest way to get your results is to activate your My Chart account. Instructions are located on the last page of this paperwork. If you have not heard from [place] regarding the results in 2 weeks, please contact this office.    Other referrals

## 2020-04-06 NOTE — Progress Notes (Signed)
Subjective:  Patient ID: Kathy Barron, female    DOB: May 08, 1961  Age: 59 y.o. MRN: [id number]  CC:  Chief Complaint  Patient presents with  . Establish Care    pt reports as far as her general health she feels fine witn no complaints.  . Hypertension    Pt reports no issues with BP since last OV. Pt checks her BP 2x weekly. avrage readings is from 130-140/ 60-80. pt has had sone tension headaches, but no other physical symptoms.    HPI Kathy Barron presents for  Establish care, med review.  Previous patient of Dr. Nolon Rod, last visit in March by video visit for UTI.  Hypertension: Treated with lisinopril HCTZ 20/12.5 mg twice per day.  Has not had recent blood work. No missed doses recently. No new side effects. Rare work associated  tension headache, no other new symptoms.  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  No alcohol or tobacco.  Home readings: 130-140s/70-80's.  BP Readings from Last 3 Encounters:  04/06/20 (!) 178/93  11/14/19 (!) 142/86  08/01/19 140/80   Lab Results  Component Value Date   CREATININE 0.81 07/29/2018    Hyperlipidemia: Cholesterol borderline in November 2019, plan for continued monitoring. Fasting today.  Lab Results  Component Value Date   CHOL 210 (H) 07/29/2018   HDL 58 07/29/2018   LDLCALC 133 (H) 07/29/2018   TRIG 97 07/29/2018   CHOLHDL 3.6 07/29/2018   Lab Results  Component Value Date   ALT 16 07/29/2018   AST 19 07/29/2018   ALKPHOS 85 07/29/2018   BILITOT 0.7 07/29/2018    Obesity: Body mass index is 41.82 kg/m.  Wt Readings from Last 3 Encounters:  04/06/20 267 lb (121.1 kg)  11/14/19 261 lb 6.4 oz (118.6 kg)  08/01/19 252 lb 12.8 oz (114.7 kg)  no specific weight goal.  No working out at [employer] during pandemic, some walking, limited prior by knee injury - better now.  Restarted exercise at gym recently.  Rare soda - less than once per week.  Fast food: 3 per week. Combo of cooking at home and carry out. Taking lunch daily.    Rare breakfast.   Health maintenance: HIV/hepatitis C screening test postponed.  Pap testing  May of this year - OBGYN Dr. Ouida Sills. Had some bleeding - thinning of lining. Testing ok. Monitoring now.  Mammogram May of this year - at [facility].  Colonoscopy July 2020.   History Patient Active Problem List   Diagnosis Date Noted  . Uterine leiomyoma 07/29/2018  . Class 2 obesity due to excess calories without serious comorbidity with body mass index (BMI) of 37.0 to 37.9 in adult 07/27/2017  . Essential hypertension 07/23/2016   Past Medical History:  Diagnosis Date  . Hypertension    Past Surgical History:  Procedure Laterality Date  . CHOLECYSTECTOMY  2009  . TUBAL LIGATION     No Known Allergies Prior to Admission medications   Medication Sig Start Date End Date Taking? Authorizing Provider  lisinopril-hydrochlorothiazide (ZESTORETIC) 20-12.5 MG tablet Take 1 tablet by mouth 2 (two) times daily. 11/14/19  Yes Maximiano Coss, NP  Multiple Vitamins-Minerals (MULTIVITAMIN ADULT EXTRA C PO) multivitamin   Yes [provider]  gabapentin (NEURONTIN) 300 MG capsule Take 1 capsule (300 mg total) by mouth 2 (two) times daily. Patient not taking: Reported on 03/31/2019 01/27/19   Forrest Moron, MD  naproxen (EC NAPROSYN) 500 MG EC tablet Take 1 tablet (500 mg total) by mouth 2 (two)  times daily with a meal. Patient not taking: Reported on 11/14/2019 08/01/19   Rutherford Guys, MD  sulfamethoxazole-trimethoprim (BACTRIM) 400-80 MG tablet Take 1 tablet by mouth 2 (two) times daily. Patient not taking: Reported on 11/21/2019 11/14/19   Maximiano Coss, NP   Social History   Socioeconomic History  . Marital status: Single    Spouse name: Not on file  . Number of children: Not on file  . Years of education: Not on file  . Highest education level: Not on file  Occupational History  . Not on file  Tobacco Use  . Smoking status: Never Smoker  . Smokeless tobacco: Never Used   Vaping Use  . Vaping Use: Never used  Substance and Sexual Activity  . Alcohol use: No  . Drug use: No  . Sexual activity: Yes  Other Topics Concern  . Not on file  Social History Narrative  . Not on file   Social Determinants of Health   Financial Resource Strain:   . Difficulty of Paying Living Expenses:   Food Insecurity:   . Worried About Charity fundraiser in the Last Year:   . Arboriculturist in the Last Year:   Transportation Needs:   . Film/video editor (Medical):   Marland Kitchen Lack of Transportation (Non-Medical):   Physical Activity:   . Days of Exercise per Week:   . Minutes of Exercise per Session:   Stress:   . Feeling of Stress :   Social Connections:   . Frequency of Communication with Friends and Family:   . Frequency of Social Gatherings with Friends and Family:   . Attends Religious Services:   . Active Member of Clubs or Organizations:   . Attends Archivist Meetings:   Marland Kitchen Marital Status:   Intimate Partner Violence:   . Fear of Current or Ex-Partner:   . Emotionally Abused:   Marland Kitchen Physically Abused:   . Sexually Abused:     Review of Systems  Constitutional: Negative for fatigue and unexpected weight change.  Respiratory: Negative for chest tightness and shortness of breath.   Cardiovascular: Negative for chest pain, palpitations and leg swelling.  Gastrointestinal: Negative for abdominal pain and blood in stool.  Neurological: Negative for dizziness, syncope, light-headedness and headaches.     Objective:   Vitals:   04/06/20 1145  BP: (!) 178/93  Pulse: 74  Temp: 98.4 F (36.9 C)  TempSrc: Temporal  SpO2: 97%  Weight: 267 lb (121.1 kg)  Height: 5\' 7"  (1.702 m)     Physical Exam Vitals reviewed.  Constitutional:      Appearance: She is well-developed.  HENT:     Head: Normocephalic and atraumatic.  Eyes:     Conjunctiva/sclera: Conjunctivae normal.     Pupils: Pupils are equal, round, and reactive to light.  Neck:      Vascular: No carotid bruit.  Cardiovascular:     Rate and Rhythm: Normal rate and regular rhythm.     Heart sounds: Normal heart sounds.  Pulmonary:     Effort: Pulmonary effort is normal.     Breath sounds: Normal breath sounds.  Abdominal:     Palpations: Abdomen is soft. There is no pulsatile mass.     Tenderness: There is no abdominal tenderness.  Skin:    General: Skin is warm and dry.  Neurological:     Mental Status: She is alert and oriented to person, place, and time.  Psychiatric:  Behavior: Behavior normal.        Assessment & Plan:  Kathy Barron is a 59 y.o. female . Essential hypertension - Plan: Lipid panel, Comprehensive metabolic panel, lisinopril-hydrochlorothiazide (ZESTORETIC) 20-12.5 MG tablet, amLODipine (NORVASC) 2.5 MG tablet, TSH  -Control, add amlodipine 2.5 mg daily, monitor home readings, potential side effects discussed, recheck 3 months.  Suspect component of whitecoat hypertension based on home readings.  Check labs  Screening for diabetes mellitus - Plan: Hemoglobin A1c  Hyperlipidemia, unspecified hyperlipidemia type  -Slight hyperlipidemia in 2018, updated labs obtained.  Concept of ASCVD risk score and statin recommendations were discussed.  We will wait on labs to decide.  BMI 40.0-44.9, adult ([place]) - Plan: Hemoglobin A1c, TSH  -Commended on restart of exercise, other recommendations including breakfast each day and meal planning discussed, home meal prep discussed as much as possible.  Screening for thyroid disorder - Plan: TSH   Meds ordered this encounter  Medications  . lisinopril-hydrochlorothiazide (ZESTORETIC) 20-12.5 MG tablet    Sig: Take 1 tablet by mouth 2 (two) times daily.    Dispense:  180 tablet    Refill:  1  . amLODipine (NORVASC) 2.5 MG tablet    Sig: Take 1 tablet (2.5 mg total) by mouth daily.    Dispense:  90 tablet    Refill:  1   Patient Instructions    Try to eat some form of breakfast daily.  Continue to try to prepare most of your food, avoid fast food as much as possible. Keep up the good work with exercise!  Add amlodipine once per day. Continue other blood pressure med same dose.   I will check some labs today. Follow up in 3 months, but let me know if any questions sooner. Thanks for coming in today.   If you have lab work done today you will be contacted with your lab results within the next 2 weeks.  If you have not heard from [place] then please contact us. The fastest way to get your results is to register for My Chart.   IF you received an x-ray today, you will receive an invoice from [facility]. Please contact [facility] Radiology at [phone] with questions or concerns regarding your invoice.   IF you received labwork today, you will receive an invoice from [place]. Please contact [facility] at [phone] with questions or concerns regarding your invoice.   Our billing staff will not be able to assist you with questions regarding bills from these companies.  You will be contacted with the lab results as soon as they are available. The fastest way to get your results is to activate your My Chart account. Instructions are located on the last page of this paperwork. If you have not heard from [place] regarding the results in 2 weeks, please contact this office.    Other referrals     Signed, Merri Ray, MD [facility]

## 2020-04-07 LAB — COMPREHENSIVE METABOLIC PANEL
ALT: 13 IU/L (ref 0–32)
AST: 17 IU/L (ref 0–40)
Albumin/Globulin Ratio: 1.4 (ref 1.2–2.2)
Albumin: 4.3 g/dL (ref 3.8–4.9)
Alkaline Phosphatase: 100 IU/L (ref 48–121)
BUN/Creatinine Ratio: 13 (ref 12–28)
BUN: 10 mg/dL (ref 8–27)
Bilirubin Total: 0.8 mg/dL (ref 0.0–1.2)
CO2: 24 mmol/L (ref 20–29)
Calcium: 9.7 mg/dL (ref 8.7–10.3)
Chloride: 99 mmol/L (ref 96–106)
Creatinine, Ser: 0.77 mg/dL (ref 0.57–1.00)
GFR calc Af Amer: 97 mL/min/{1.73_m2} (ref >59)
GFR calc non Af Amer: 84 mL/min/{1.73_m2} (ref >59)
Globulin, Total: 3.1 g/dL (ref 1.5–4.5)
Glucose: 79 mg/dL (ref 65–99)
Potassium: 4 mmol/L (ref 3.5–5.2)
Sodium: 139 mmol/L (ref 134–144)
Total Protein: 7.4 g/dL (ref 6.0–8.5)

## 2020-04-07 LAB — HEMOGLOBIN A1C
Est. average glucose Bld gHb Est-mCnc: 103 mg/dL
Hgb A1c MFr Bld: 5.2 % (ref 4.8–5.6)

## 2020-04-07 LAB — LIPID PANEL
Chol/HDL Ratio: 4.1 ratio (ref 0.0–4.4)
Cholesterol, Total: 223 mg/dL — ABNORMAL HIGH (ref 100–199)
HDL: 55 mg/dL (ref >39)
LDL Chol Calc (NIH): 153 mg/dL — ABNORMAL HIGH (ref 0–99)
Triglycerides: 84 mg/dL (ref 0–149)
VLDL Cholesterol Cal: 15 mg/dL (ref 5–40)

## 2020-04-07 LAB — TSH: TSH: 1.83 u[IU]/mL (ref 0.450–4.500)

## 2020-04-17 ENCOUNTER — Encounter: Payer: Self-pay | Admitting: Family Medicine

## 2020-06-13 ENCOUNTER — Telehealth: Payer: Self-pay | Admitting: *Deleted

## 2020-06-13 NOTE — Telephone Encounter (Signed)
Schedule Mammo- mobile unit 9-27 at [facility] [place]

## 2020-07-06 ENCOUNTER — Ambulatory Visit: Payer: BC Managed Care – PPO | Admitting: Family Medicine

## 2020-07-26 ENCOUNTER — Encounter: Payer: Self-pay | Admitting: Family Medicine

## 2020-07-26 ENCOUNTER — Other Ambulatory Visit: Payer: Self-pay

## 2020-07-26 ENCOUNTER — Ambulatory Visit: Payer: BC Managed Care – PPO | Admitting: Family Medicine

## 2020-07-26 VITALS — BP 142/80 | HR 103 | Temp 98.0°F | Ht 67.0 in | Wt 258.0 lb

## 2020-07-26 DIAGNOSIS — I10 Essential (primary) hypertension: Secondary | ICD-10-CM

## 2020-07-26 DIAGNOSIS — Z23 Encounter for immunization: Secondary | ICD-10-CM | POA: Diagnosis not present

## 2020-07-26 MED ORDER — AMLODIPINE BESYLATE 5 MG PO TABS: 5.0000 mg | ORAL_TABLET | Freq: Every day | ORAL | 1 refills | Status: DC

## 2020-07-26 MED ORDER — LISINOPRIL-HYDROCHLOROTHIAZIDE 20-12.5 MG PO TABS: 1.0000 | ORAL_TABLET | Freq: Two times a day (BID) | ORAL | 1 refills | Status: DC

## 2020-07-26 NOTE — Progress Notes (Signed)
Subjective:  Patient ID: Marcella Dubs, female    DOB: 08/29/1961  Age: 59 y.o. MRN: [id number]  CC:  Chief Complaint  Patient presents with  . Hypertension    Pt reports she checks BP at home and the BP has improved since last OV. Pt states it's still running high, but it's getting better. Pt reports no physical symptoms and her current medication works well with no side effects.    HPI Soul Deveney presents for   Hypertension: Suspected component of whitecoat hypertension but still uncontrolled at July 16 visit.  Home readings 130-140/70-80s at time,  added amlodipine 2.5 mg daily, continue lisinopril HCTZ 20/12.5 mg twice daily. No new side effects on amlodipine. Only occasional min ankle swelling.  Home readings: 140's/80's.  BP Readings from Last 3 Encounters:  07/26/20 (!) 142/80  04/06/20 (!) 178/93  11/14/19 (!) 142/86   Lab Results  Component Value Date   CREATININE 0.77 04/06/2020   Hyperlipidemia: No current statin.  Trying to exercise more, more walking. 9# weight loss.   The 10-year ASCVD risk score Mikey Bussing DC Brooke Bonito., et al., 2013) is: 10.1%   Values used to calculate the score:     Age: 55 years     Sex: Female     Is Non-Hispanic African American: Yes     Diabetic: No     Tobacco smoker: No     Systolic Blood Pressure: 007 mmHg     Is BP treated: Yes     HDL Cholesterol: 55 mg/dL     Total Cholesterol: 223 mg/dL   Wt Readings from Last 3 Encounters:  07/26/20 258 lb (117 kg)  04/06/20 267 lb (121.1 kg)  11/14/19 261 lb 6.4 oz (118.6 kg)    Lab Results  Component Value Date   CHOL 223 (H) 04/06/2020   HDL 55 04/06/2020   LDLCALC 153 (H) 04/06/2020   TRIG 84 04/06/2020   CHOLHDL 4.1 04/06/2020   Lab Results  Component Value Date   ALT 13 04/06/2020   AST 17 04/06/2020   ALKPHOS 100 04/06/2020   BILITOT 0.8 04/06/2020       History Patient Active Problem List   Diagnosis Date Noted  . Uterine leiomyoma 07/29/2018  . Class 2 obesity due  to excess calories without serious comorbidity with body mass index (BMI) of 37.0 to 37.9 in adult 07/27/2017  . Essential hypertension 07/23/2016   Past Medical History:  Diagnosis Date  . Hypertension    Past Surgical History:  Procedure Laterality Date  . CHOLECYSTECTOMY  2009  . TUBAL LIGATION     No Known Allergies Prior to Admission medications   Medication Sig Start Date End Date Taking? Authorizing Provider  amLODipine (NORVASC) 2.5 MG tablet Take 1 tablet (2.5 mg total) by mouth daily. 04/06/20   Wendie Agreste, MD  lisinopril-hydrochlorothiazide (ZESTORETIC) 20-12.5 MG tablet Take 1 tablet by mouth 2 (two) times daily. 04/06/20   Wendie Agreste, MD  Multiple Vitamins-Minerals (MULTIVITAMIN ADULT EXTRA C PO) multivitamin    [provider]   Social History   Socioeconomic History  . Marital status: Single    Spouse name: Not on file  . Number of children: Not on file  . Years of education: Not on file  . Highest education level: Not on file  Occupational History  . Not on file  Tobacco Use  . Smoking status: Never Smoker  . Smokeless tobacco: Never Used  Vaping Use  . Vaping Use: Never  used  Substance and Sexual Activity  . Alcohol use: No  . Drug use: No  . Sexual activity: Yes  Other Topics Concern  . Not on file  Social History Narrative  . Not on file   Social Determinants of Health   Financial Resource Strain:   . Difficulty of Paying Living Expenses: Not on file  Food Insecurity:   . Worried About Charity fundraiser in the Last Year: Not on file  . Ran Out of Food in the Last Year: Not on file  Transportation Needs:   . Lack of Transportation (Medical): Not on file  . Lack of Transportation (Non-Medical): Not on file  Physical Activity:   . Days of Exercise per Week: Not on file  . Minutes of Exercise per Session: Not on file  Stress:   . Feeling of Stress : Not on file  Social Connections:   . Frequency of Communication with  Friends and Family: Not on file  . Frequency of Social Gatherings with Friends and Family: Not on file  . Attends Religious Services: Not on file  . Active Member of Clubs or Organizations: Not on file  . Attends Archivist Meetings: Not on file  . Marital Status: Not on file  Intimate Partner Violence:   . Fear of Current or Ex-Partner: Not on file  . Emotionally Abused: Not on file  . Physically Abused: Not on file  . Sexually Abused: Not on file    Review of Systems  Constitutional: Negative for fatigue and unexpected weight change.  Respiratory: Negative for chest tightness and shortness of breath.   Cardiovascular: Negative for chest pain, palpitations and leg swelling.  Gastrointestinal: Negative for abdominal pain and blood in stool.  Neurological: Negative for dizziness, syncope, light-headedness and headaches.     Objective:   Vitals:   07/26/20 1449 07/26/20 1454  BP: (!) 149/80 (!) 142/80  Pulse: (!) 103   Temp: 98 F (36.7 C)   TempSrc: Temporal   SpO2: 99%   Weight: 258 lb (117 kg)   Height: 5\' 7"  (1.702 m)      Physical Exam Vitals reviewed.  Constitutional:      Appearance: She is well-developed.  HENT:     Head: Normocephalic and atraumatic.  Eyes:     Conjunctiva/sclera: Conjunctivae normal.     Pupils: Pupils are equal, round, and reactive to light.  Neck:     Vascular: No carotid bruit.  Cardiovascular:     Rate and Rhythm: Normal rate and regular rhythm.     Heart sounds: Normal heart sounds.  Pulmonary:     Effort: Pulmonary effort is normal.     Breath sounds: Normal breath sounds.  Abdominal:     Palpations: Abdomen is soft. There is no pulsatile mass.     Tenderness: There is no abdominal tenderness.  Musculoskeletal:     Right lower leg: No edema.     Left lower leg: No edema.  Skin:    General: Skin is warm and dry.  Neurological:     Mental Status: She is alert and oriented to person, place, and time.  Psychiatric:         Behavior: Behavior normal.     Assessment & Plan:  Bonny Vanleeuwen is a 59 y.o. female . Essential hypertension - Plan: lisinopril-hydrochlorothiazide (ZESTORETIC) 20-12.5 MG tablet, amLODipine (NORVASC) 5 MG tablet  Need for vaccination - Plan: Flu Vaccine QUAD 36+ mos IM  Increase amlodipine to 5mg  -  possible side effects reviewed. Commended on weight loss. Repeat lipid panel in 3 months. No other med changes at this time.   No orders of the defined types were placed in this encounter.  Patient Instructions   Try higher dose amlodipine at 5mg  per day. No other changes.  Recheck in 3 months and thanks for coming in today.     If you have lab work done today you will be contacted with your lab results within the next 2 weeks.  If you have not heard from [place] then please contact us. The fastest way to get your results is to register for My Chart.   IF you received an x-ray today, you will receive an invoice from [facility] Radiology. Please contact [facility] Radiology at [phone] with questions or concerns regarding your invoice.   IF you received labwork today, you will receive an invoice from [place]. Please contact [facility] at [phone] with questions or concerns regarding your invoice.   Our billing staff will not be able to assist you with questions regarding bills from these companies.  You will be contacted with the lab results as soon as they are available. The fastest way to get your results is to activate your My Chart account. Instructions are located on the last page of this paperwork. If you have not heard from [place] regarding the results in 2 weeks, please contact this office.         Signed, Merri Ray, MD [facility]

## 2020-07-26 NOTE — Patient Instructions (Addendum)
Try higher dose amlodipine at 5mg  per day. No other changes.  Recheck in 3 months and thanks for coming in today.     If you have lab work done today you will be contacted with your lab results within the next 2 weeks.  If you have not heard from [place] then please contact us. The fastest way to get your results is to register for My Chart.   IF you received an x-ray today, you will receive an invoice from [facility]iology. Please contact [facility] Radiology at [phone] with questions or concerns regarding your invoice.   IF you received labwork today, you will receive an invoice from [place]. Please contact [facility] at [phone] with questions or concerns regarding your invoice.   Our billing staff will not be able to assist you with questions regarding bills from these companies.  You will be contacted with the lab results as soon as they are available. The fastest way to get your results is to activate your My Chart account. Instructions are located on the last page of this paperwork. If you have not heard from [place] regarding the results in 2 weeks, please contact this office.

## 2020-08-13 ENCOUNTER — Ambulatory Visit: Payer: BC Managed Care – PPO | Attending: Internal Medicine

## 2020-08-13 DIAGNOSIS — Z23 Encounter for immunization: Secondary | ICD-10-CM

## 2020-08-13 NOTE — Progress Notes (Signed)
   Covid-19 Vaccination Clinic  Name:  Kathy Barron    MRN: [id number] DOB: 1961/04/27  08/13/2020  Kathy Barron was observed post Covid-19 immunization for 15 minutes without incident. She was provided with Vaccine Information Sheet and instruction to access the V-Safe system.   Kathy Barron was instructed to call 911 with any severe reactions post vaccine: Marland Kitchen Difficulty breathing  . Swelling of face and throat  . A fast heartbeat  . A bad rash all over body  . Dizziness and weakness   Immunizations Administered    No immunizations on file.

## 2020-10-17 ENCOUNTER — Other Ambulatory Visit: Payer: BC Managed Care – PPO

## 2020-10-26 ENCOUNTER — Ambulatory Visit: Payer: BC Managed Care – PPO | Admitting: Family Medicine

## 2020-10-26 ENCOUNTER — Encounter: Payer: Self-pay | Admitting: Family Medicine

## 2020-10-26 ENCOUNTER — Other Ambulatory Visit: Payer: Self-pay

## 2020-10-26 VITALS — BP 144/82 | HR 87 | Temp 98.1°F | Ht 67.0 in | Wt 254.0 lb

## 2020-10-26 DIAGNOSIS — I10 Essential (primary) hypertension: Secondary | ICD-10-CM

## 2020-10-26 DIAGNOSIS — E785 Hyperlipidemia, unspecified: Secondary | ICD-10-CM

## 2020-10-26 DIAGNOSIS — M25532 Pain in left wrist: Secondary | ICD-10-CM

## 2020-10-26 MED ORDER — AMLODIPINE BESYLATE 10 MG PO TABS: 10.0000 mg | ORAL_TABLET | Freq: Every day | ORAL | 1 refills | Status: DC

## 2020-10-26 MED ORDER — LISINOPRIL-HYDROCHLOROTHIAZIDE 20-12.5 MG PO TABS: 1.0000 | ORAL_TABLET | Freq: Two times a day (BID) | ORAL | 1 refills | Status: DC

## 2020-10-26 NOTE — Progress Notes (Signed)
Subjective:  Patient ID: Kathy Barron, female    DOB: 07/05/1961  Age: 60 y.o. MRN: [id number]  CC:  Chief Complaint  Patient presents with  . Follow-up    On hypertension and repeat cholesterol/fasting labs. Pt reports no issues with BP since last OV. PT states her BP home readings have been around 130/84. Pt ate a pop tart this morning with coffee.     HPI Kathy Barron presents for   Hypertension: Follow-up from November 4.  Currently on amlodipine 5 mg daily, lisinopril HCTZ 20/12.5 mg Twice daily. Suspect a component of whitecoat hypertension.  Amlodipine was increased to the 5 mg dose in November. Home readings: 130's/84-85. Rare 140's.   No new side effects or swelling on amlodipine.  Min slat added to food. Min frozen food.  Some take out/fast food at times - 2-3/week.  Home cooking 4-5 times per week. Taking lunch.  BP Readings from Last 3 Encounters:  10/26/20 (!) 144/82  07/26/20 (!) 142/80  04/06/20 (!) 178/93   Lab Results  Component Value Date   CREATININE 0.77 04/06/2020   Hyperlipidemia: No current statin.last ate 5.5 hrs ago - no lunch.  No direct FH of heart disease. No prior statin Exercise: Zumba and line dancing few days per week at gym. Weight down further 4 pounds.  Wt Readings from Last 3 Encounters:  10/26/20 254 lb (115.2 kg)  07/26/20 258 lb (117 kg)  04/06/20 267 lb (121.1 kg)    The 10-year ASCVD risk score Mikey Bussing DC Jr., et al., 2013) is: 10.5%   Values used to calculate the score:     Age: 56 years     Sex: Female     Is Non-Hispanic African American: Yes     Diabetic: No     Tobacco smoker: No     Systolic Blood Pressure: 409 mmHg     Is BP treated: Yes     HDL Cholesterol: 55 mg/dL     Total Cholesterol: 223 mg/dL  Lab Results  Component Value Date   CHOL 223 (H) 04/06/2020   HDL 55 04/06/2020   LDLCALC 153 (H) 04/06/2020   TRIG 84 04/06/2020   CHOLHDL 4.1 04/06/2020   Lab Results  Component Value Date   ALT 13  04/06/2020   AST 17 04/06/2020   ALKPHOS 100 04/06/2020   BILITOT 0.8 04/06/2020   L wrist pain: Past few weeks.  Prior left wrist fx in middle school.  Using wrist brace past few weeks. NKI. Ache on outide/ulnar side of wrist.  Ache in evening at times. More sore few nights.  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  No hx of CTS. No hand/finger numbness. No neck pain/radiating arm pain.  Wears a fit bit on that area     History Patient Active Problem List   Diagnosis Date Noted  . Uterine leiomyoma 07/29/2018  . Class 2 obesity due to excess calories without serious comorbidity with body mass index (BMI) of 37.0 to 37.9 in adult 07/27/2017  . Essential hypertension 07/23/2016   Past Medical History:  Diagnosis Date  . Hypertension    Past Surgical History:  Procedure Laterality Date  . CHOLECYSTECTOMY  2009  . TUBAL LIGATION     No Known Allergies Prior to Admission medications   Medication Sig Start Date End Date Taking? Authorizing Provider  amLODipine (NORVASC) 5 MG tablet Take 1 tablet (5 mg total) by mouth daily. 07/26/20  Yes Wendie Agreste, MD  lisinopril-hydrochlorothiazide (ZESTORETIC) 20-12.5 MG tablet Take 1 tablet by mouth 2 (two) times daily. 07/26/20  Yes Wendie Agreste, MD  Multiple Vitamins-Minerals (MULTIVITAMIN ADULT EXTRA C PO) multivitamin   Yes [provider]   Social History   Socioeconomic History  . Marital status: Single    Spouse name: Not on file  . Number of children: Not on file  . Years of education: Not on file  . Highest education level: Not on file  Occupational History  . Not on file  Tobacco Use  . Smoking status: Never Smoker  . Smokeless tobacco: Never Used  Vaping Use  . Vaping Use: Never used  Substance and Sexual Activity  . Alcohol use: No  . Drug use: No  . Sexual activity: Yes  Other Topics Concern  . Not on file  Social History Narrative  . Not on file   Social Determinants of Health    Financial Resource Strain: Not on file  Food Insecurity: Not on file  Transportation Needs: Not on file  Physical Activity: Not on file  Stress: Not on file  Social Connections: Not on file  Intimate Partner Violence: Not on file    Review of Systems  Constitutional: Negative for fatigue and unexpected weight change.  Respiratory: Negative for chest tightness and shortness of breath.   Cardiovascular: Negative for chest pain, palpitations and leg swelling.  Gastrointestinal: Negative for abdominal pain and blood in stool.  Neurological: Negative for dizziness, syncope, light-headedness and headaches.     Objective:   Vitals:   10/26/20 1408 10/26/20 1410  BP: (!) 152/76 (!) 144/82  Pulse: 87   Temp: 98.1 F (36.7 C)   TempSrc: Temporal   SpO2: 95%   Weight: 254 lb (115.2 kg)   Height: 5\' 7"  (1.702 m)      Physical Exam Vitals reviewed.  Constitutional:      Appearance: She is well-developed and well-nourished.  HENT:     Head: Normocephalic and atraumatic.  Eyes:     Extraocular Movements: EOM normal.     Conjunctiva/sclera: Conjunctivae normal.     Pupils: Pupils are equal, round, and reactive to light.  Neck:     Vascular: No carotid bruit.  Cardiovascular:     Rate and Rhythm: Normal rate and regular rhythm.     Pulses: Intact distal pulses.     Heart sounds: Normal heart sounds.  Pulmonary:     Effort: Pulmonary effort is normal.     Breath sounds: Normal breath sounds.  Abdominal:     Palpations: Abdomen is soft. There is no pulsatile mass.     Tenderness: There is no abdominal tenderness.  Musculoskeletal:     Comments: Left wrist full range of motion, some ulnar-sided discomfort with ulnar deviation.  Skin intact, no soft tissue swelling.  Neurovascular intact distally.  Tender palpation over ulnar styloid and distal area 2 cc, but primarily distal ulna.  Negative Tinel over Guyon's canal.  Negative Tinel's mid wrist, negative Phalen.  Skin:     General: Skin is warm and dry.  Neurological:     Mental Status: She is alert and oriented to person, place, and time.  Psychiatric:        Mood and Affect: Mood and affect normal.        Behavior: Behavior normal.    38 minutes spent during visit, greater than 50% counseling and assimilation of information, chart review, and discussion of plan.    Assessment & Plan:  Kathy Barron is a 60 y.o. female . Essential hypertension - Plan: Lipid panel, Comprehensive metabolic panel, amLODipine (NORVASC) 10 MG tablet, lisinopril-hydrochlorothiazide (ZESTORETIC) 20-12.5 MG tablet  -Still somewhat decreased control, will try higher dose of amlodipine at 10 mg daily, potential side effects discussed, continue lisinopril, HCTZ for total dose of 40/25 mg daily.  Recheck 6 months if levels improved.  RTC precautions if side effects or new symptoms  Hyperlipidemia, unspecified hyperlipidemia type - Plan: Lipid panel, Comprehensive metabolic panel  -Commended on weight loss, anticipate cholesterol also improved, holding meds at this time as borderline ASCVD risk score.  Left wrist pain - Plan: DG Wrist Complete Left  -No known injury, could be irritation from compression from Fitbit or with other activities.  No sign of carpal tunnel syndrome at this time, minimal ulnar neuropathy symptoms but negative Tinel's.  Check imaging, continue wrist brace next 1 to 2 weeks with range of motion few times per day, activity modification.  RTC precautions if persistent symptoms.  X-ray ordered.   Meds ordered this encounter  Medications  . amLODipine (NORVASC) 10 MG tablet    Sig: Take 1 tablet (10 mg total) by mouth daily.    Dispense:  90 tablet    Refill:  1  . lisinopril-hydrochlorothiazide (ZESTORETIC) 20-12.5 MG tablet    Sig: Take 1 tablet by mouth 2 (two) times daily.    Dispense:  180 tablet    Refill:  1   Patient Instructions    Can try 10mg  dose amlodipine, but if new side effects or low blood  pressures, let me know, and return to 5mg  dose. No change in other med for now.   Avoid constrictive bracelets for use of sit bit to left wrist for now.  Okay to use wrist brace but commended that at least 2 or 3 times a day for range of motion.  X-ray was ordered at [address], [place] [facility].  Recheck with me in the next 2 weeks if that wrist pain is persisting, sooner if worsening.  Keep up the good work with weight loss.  I suspect cholesterol will improve, and will check levels today.  No new medications for now.   If you have lab work done today you will be contacted with your lab results within the next 2 weeks.  If you have not heard from [place] then please contact us. The fastest way to get your results is to register for My Chart.   IF you received an x-ray today, you will receive an invoice from [facility]. Please contact [facility] at [phone] with questions or concerns regarding your invoice.   IF you received labwork today, you will receive an invoice from [facility]. Please contact [facility] at [phone] with questions or concerns regarding your invoice.   Our billing staff will not be able to assist you with questions regarding bills from these companies.  You will be contacted with the lab results as soon as they are available. The fastest way to get your results is to activate your My Chart account. Instructions are located on the last page of this paperwork. If you have not heard from [place] regarding the results in 2 weeks, please contact this office.         Signed, Merri Ray, MD [facility]

## 2020-10-26 NOTE — Patient Instructions (Addendum)
  Can try 10mg  dose amlodipine, but if new side effects or low blood pressures, let me know, and return to 5mg  dose. No change in other med for now.   Avoid constrictive bracelets for use of sit bit to left wrist for now.  Okay to use wrist brace but commended that at least 2 or 3 times a day for range of motion.  X-ray was ordered at [address],  imaging.  Recheck with me in the next 2 weeks if that wrist pain is persisting, sooner if worsening.  Keep up the good work with weight loss.  I suspect cholesterol will improve, and will check levels today.  No new medications for now.   If you have lab work done today you will be contacted with your lab results within the next 2 weeks.  If you have not heard from [place] then please contact us. The fastest way to get your results is to register for My Chart.   IF you received an x-ray today, you will receive an invoice from [facility]. Please contact [facility] at [phone] with questions or concerns regarding your invoice.   IF you received labwork today, you will receive an invoice from [facility]. Please contact [facility] at [phone] with questions or concerns regarding your invoice.   Our billing staff will not be able to assist you with questions regarding bills from these companies.  You will be contacted with the lab results as soon as they are available. The fastest way to get your results is to activate your My Chart account. Instructions are located on the last page of this paperwork. If you have not heard from [place] regarding the results in 2 weeks, please contact this office.

## 2020-10-27 LAB — LIPID PANEL
Chol/HDL Ratio: 4.4 ratio (ref 0.0–4.4)
Cholesterol, Total: 214 mg/dL — ABNORMAL HIGH (ref 100–199)
HDL: 49 mg/dL (ref >39)
LDL Chol Calc (NIH): 149 mg/dL — ABNORMAL HIGH (ref 0–99)
Triglycerides: 88 mg/dL (ref 0–149)
VLDL Cholesterol Cal: 16 mg/dL (ref 5–40)

## 2020-10-27 LAB — COMPREHENSIVE METABOLIC PANEL
ALT: 13 IU/L (ref 0–32)
AST: 19 IU/L (ref 0–40)
Albumin/Globulin Ratio: 1.4 (ref 1.2–2.2)
Albumin: 4.5 g/dL (ref 3.8–4.9)
Alkaline Phosphatase: 91 IU/L (ref 44–121)
BUN/Creatinine Ratio: 16 (ref 12–28)
BUN: 13 mg/dL (ref 8–27)
Bilirubin Total: 0.6 mg/dL (ref 0.0–1.2)
CO2: 26 mmol/L (ref 20–29)
Calcium: 9.4 mg/dL (ref 8.7–10.3)
Chloride: 99 mmol/L (ref 96–106)
Creatinine, Ser: 0.81 mg/dL (ref 0.57–1.00)
GFR calc Af Amer: 91 mL/min/{1.73_m2} (ref >59)
GFR calc non Af Amer: 79 mL/min/{1.73_m2} (ref >59)
Globulin, Total: 3.2 g/dL (ref 1.5–4.5)
Glucose: 70 mg/dL (ref 65–99)
Potassium: 4.3 mmol/L (ref 3.5–5.2)
Sodium: 140 mmol/L (ref 134–144)
Total Protein: 7.7 g/dL (ref 6.0–8.5)

## 2020-11-07 ENCOUNTER — Encounter: Payer: Self-pay | Admitting: Family Medicine

## 2020-12-21 LAB — HM PAP SMEAR

## 2021-03-06 ENCOUNTER — Other Ambulatory Visit: Payer: Self-pay

## 2021-03-06 ENCOUNTER — Ambulatory Visit: Payer: BC Managed Care – PPO | Admitting: Family Medicine

## 2021-03-06 VITALS — BP 132/76 | HR 82 | Temp 98.0°F | Resp 17 | Ht 67.0 in | Wt 262.4 lb

## 2021-03-06 DIAGNOSIS — M25512 Pain in left shoulder: Secondary | ICD-10-CM

## 2021-03-06 DIAGNOSIS — M62838 Other muscle spasm: Secondary | ICD-10-CM

## 2021-03-06 MED ORDER — CYCLOBENZAPRINE HCL 5 MG PO TABS: 5.0000 mg | ORAL_TABLET | Freq: Three times a day (TID) | ORAL | 0 refills | Status: DC | PRN

## 2021-03-06 NOTE — Patient Instructions (Signed)
Pain in your shoulder and upper back likely is related to muscle spasm and possible pinched nerve from your left-side of neck.  Okay to continue Advil or Aleve over-the-counter short-term.  Muscle relaxant up to every 8 hours but I usually recommend that at bedtime due to sedation.  Do not drive or operate machinery on that medication.  Heat or ice to that affected area and gentle range of motion for the next few days.  If that is not improving next 1 week or worsening sooner please return for recheck.  Muscle Cramps and Spasms Muscle cramps and spasms are when muscles tighten by themselves. They usually get better within minutes. Muscle cramps are painful. They are usually stronger and last longer than muscle spasms. Muscle spasms may or may not be painful.They can last a few seconds or much longer. Cramps and spasms can affect any muscle, but they occur most often in the calf muscles of the leg. They are usually not caused by a serious problem. In many cases, the cause is not known. Some common causes include: Doing more physical work or exercise than your body is ready for. Using the muscles too much (overuse) by repeating certain movements too many times. Staying in a certain position for a long time. Playing a sport or doing an activity without preparing properly. Using bad form or technique while playing a sport or doing an activity. Not having enough water in your body (dehydration). Injury. Side effects of some medicines. Low levels of the salts and minerals in your blood (electrolytes), such as low potassium or calcium. Follow these instructions at home: Managing pain and stiffness     Massage, stretch, and relax the muscle. Do this for many minutes at a time. If told, put heat on tight or tense muscles as often as told by your doctor. Use the heat source that your doctor recommends, such as a moist heat pack or a heating pad. Place a towel between your skin and the heat source. Leave  the heat on for 20-30 minutes. Remove the heat if your skin turns bright red. This is very important if you are not able to feel pain, heat, or cold. You may have a greater risk of getting burned. If told, put ice on the affected area. This may help if you are sore or have pain after a cramp or spasm. Put ice in a plastic bag. Place a towel between your skin and the bag. Leave the ice on for 20 minutes, 2-3 times a day. Try taking hot showers or baths to help relax tight muscles. Eating and drinking Drink enough fluid to keep your pee (urine) pale yellow. Eat a healthy diet to help ensure that your muscles work well. This should include: Fruits and vegetables. Lean protein. Whole grains. Low-fat or nonfat dairy products. General instructions If you are having cramps often, avoid intense exercise for several days. Take over-the-counter and prescription medicines only as told by your doctor. Watch for any changes in your symptoms. Keep all follow-up visits as told by your doctor. This is important. Contact a doctor if: Your cramps or spasms get worse or happen more often. Your cramps or spasms do not get better with time. Summary Muscle cramps and spasms are when muscles tighten by themselves. They usually get better within minutes. Cramps and spasms occur most often in the calf muscles of the leg. Massage, stretch, and relax the muscle. This may help the cramp or spasm go away. Drink enough fluid  to keep your pee (urine) pale yellow. This information is not intended to replace advice given to you by your health care provider. Make sure you discuss any questions you have with your healthcare provider. Document Revised: 02/01/2018 Document Reviewed: 02/01/2018 Elsevier Patient Education  [place].

## 2021-03-06 NOTE — Progress Notes (Signed)
Subjective:  Patient ID: Kathy Barron, female    DOB: 05/09/1961  Age: 60 y.o. MRN: [id number]  CC:  Chief Complaint  Patient presents with   Shoulder Pain    Pt reports left shoulder pain since Sunday, denies injury, pt reports with movement, aching pain not sharp.     HPI Kathy Barron presents for   L shoulder pain: Started 3 days ago - no injury/fall.  Top of left shoulder/shoulder blade to upper left arm. No pain with movement of shoulder. Sore on left side of neck, no midline neck pain. Sore to lie down at night. Ache feeling. Left hand dominant. No weakness, not dropping objects.  No change in activity or activities at gym. More tense if stressed,   Tx: advil - 1-2 pills per day past few days.    History Patient Active Problem List   Diagnosis Date Noted   Uterine leiomyoma 07/29/2018   Class 2 obesity due to excess calories without serious comorbidity with body mass index (BMI) of 37.0 to 37.9 in adult 07/27/2017   Essential hypertension 07/23/2016   Past Medical History:  Diagnosis Date   Hypertension    Past Surgical History:  Procedure Laterality Date   CHOLECYSTECTOMY  2009   TUBAL LIGATION     No Known Allergies Prior to Admission medications   Medication Sig Start Date End Date Taking? Authorizing Provider  amLODipine (NORVASC) 10 MG tablet Take 1 tablet (10 mg total) by mouth daily. 10/26/20  Yes Wendie Agreste, MD  lisinopril-hydrochlorothiazide (ZESTORETIC) 20-12.5 MG tablet Take 1 tablet by mouth 2 (two) times daily. 10/26/20  Yes Wendie Agreste, MD  Multiple Vitamins-Minerals (MULTIVITAMIN ADULT EXTRA C PO) multivitamin   Yes [provider]   Social History   Socioeconomic History   Marital status: Single    Spouse name: Not on file   Number of children: Not on file   Years of education: Not on file   Highest education level: Not on file  Occupational History   Not on file  Tobacco Use   Smoking status: Never   Smokeless  tobacco: Never  Vaping Use   Vaping Use: Never used  Substance and Sexual Activity   Alcohol use: No   Drug use: No   Sexual activity: Yes  Other Topics Concern   Not on file  Social History Narrative   Not on file   Social Determinants of Health   Financial Resource Strain: Not on file  Food Insecurity: Not on file  Transportation Needs: Not on file  Physical Activity: Not on file  Stress: Not on file  Social Connections: Not on file  Intimate Partner Violence: Not on file    Review of Systems  Per HPI.  Objective:   Vitals:   03/06/21 1610  BP: 132/76  Pulse: 82  Resp: 17  Temp: 98 F (36.7 C)  TempSrc: Temporal  SpO2: 97%  Weight: 262 lb 6.4 oz (119 kg)  Height: 5\' 7"  (1.702 m)     Physical Exam Constitutional:      General: She is not in acute distress.    Appearance: Normal appearance. She is well-developed.  HENT:     Head: Normocephalic and atraumatic.  Cardiovascular:     Rate and Rhythm: Normal rate.  Pulmonary:     Effort: Pulmonary effort is normal.  Musculoskeletal:     Comments: Range of motion cervical spine with reproduction of some of her upper back left shoulder symptoms with left  rotation greater than right lateral flexion. No midline bony tenderness.  Tender to palpation over her upper trapezius, rhomboids, slightly tender over deltoid, posterior upper arm but no focal bony tenderness of shoulder or upper arm.  AC/[place] nontender.  Full RTC strength.  Negative Neer/Hawkins.  Reflexes 2+ at biceps, triceps, brachioradialis bilaterally.  No rash  Neurological:     Mental Status: She is alert and oriented to person, place, and time.  Psychiatric:        Mood and Affect: Mood normal.    Assessment & Plan:  Kathy Barron is a 60 y.o. female . Acute pain of left shoulder - Plan: cyclobenzaprine (FLEXERIL) 5 MG tablet  Neck muscle spasm - Plan: cyclobenzaprine (FLEXERIL) 5 MG tablet  Possible early cervical radiculopathy, no known injury, no  weakness, no red flags on exam hold on imaging at this time.  Symptomatic care discussed with heat or ice, range of motion, gentle stretches, Advil or Aleve if needed short-term.  Flexeril if needed up to every 8 hours with side effects discussed.  Recheck in next 1 week if not improving, sooner if worse.  Meds ordered this encounter  Medications   cyclobenzaprine (FLEXERIL) 5 MG tablet    Sig: Take 1 tablet (5 mg total) by mouth 3 (three) times daily as needed for muscle spasms (start qhs prn due to sedation).    Dispense:  20 tablet    Refill:  0   Patient Instructions  Pain in your shoulder and upper back likely is related to muscle spasm and possible pinched nerve from your left-side of neck.  Okay to continue Advil or Aleve over-the-counter short-term.  Muscle relaxant up to every 8 hours but I usually recommend that at bedtime due to sedation.  Do not drive or operate machinery on that medication.  Heat or ice to that affected area and gentle range of motion for the next few days.  If that is not improving next 1 week or worsening sooner please return for recheck.  Muscle Cramps and Spasms Muscle cramps and spasms are when muscles tighten by themselves. They usually get better within minutes. Muscle cramps are painful. They are usually stronger and last longer than muscle spasms. Muscle spasms may or may not be painful.They can last a few seconds or much longer. Cramps and spasms can affect any muscle, but they occur most often in the calf muscles of the leg. They are usually not caused by a serious problem. In many cases, the cause is not known. Some common causes include: Doing more physical work or exercise than your body is ready for. Using the muscles too much (overuse) by repeating certain movements too many times. Staying in a certain position for a long time. Playing a sport or doing an activity without preparing properly. Using bad form or technique while playing a sport or doing an  activity. Not having enough water in your body (dehydration). Injury. Side effects of some medicines. Low levels of the salts and minerals in your blood (electrolytes), such as low potassium or calcium. Follow these instructions at home: Managing pain and stiffness     Massage, stretch, and relax the muscle. Do this for many minutes at a time. If told, put heat on tight or tense muscles as often as told by your doctor. Use the heat source that your doctor recommends, such as a moist heat pack or a heating pad. Place a towel between your skin and the heat source. Leave the heat on  for 20-30 minutes. Remove the heat if your skin turns bright red. This is very important if you are not able to feel pain, heat, or cold. You may have a greater risk of getting burned. If told, put ice on the affected area. This may help if you are sore or have pain after a cramp or spasm. Put ice in a plastic bag. Place a towel between your skin and the bag. Leave the ice on for 20 minutes, 2-3 times a day. Try taking hot showers or baths to help relax tight muscles. Eating and drinking Drink enough fluid to keep your pee (urine) pale yellow. Eat a healthy diet to help ensure that your muscles work well. This should include: Fruits and vegetables. Lean protein. Whole grains. Low-fat or nonfat dairy products. General instructions If you are having cramps often, avoid intense exercise for several days. Take over-the-counter and prescription medicines only as told by your doctor. Watch for any changes in your symptoms. Keep all follow-up visits as told by your doctor. This is important. Contact a doctor if: Your cramps or spasms get worse or happen more often. Your cramps or spasms do not get better with time. Summary Muscle cramps and spasms are when muscles tighten by themselves. They usually get better within minutes. Cramps and spasms occur most often in the calf muscles of the leg. Massage, stretch,  and relax the muscle. This may help the cramp or spasm go away. Drink enough fluid to keep your pee (urine) pale yellow. This information is not intended to replace advice given to you by your health care provider. Make sure you discuss any questions you have with your healthcare provider. Document Revised: 02/01/2018 Document Reviewed: 02/01/2018 Elsevier Patient Education  2022 [place],   Merri Ray, MD [facility] 03/06/21 4:58 PM

## 2021-03-07 ENCOUNTER — Ambulatory Visit: Payer: Self-pay | Admitting: Family Medicine

## 2021-04-22 ENCOUNTER — Ambulatory Visit: Payer: BC Managed Care – PPO | Admitting: Family Medicine

## 2021-05-30 ENCOUNTER — Ambulatory Visit: Payer: BC Managed Care – PPO | Admitting: Family Medicine

## 2021-06-24 ENCOUNTER — Other Ambulatory Visit: Payer: Self-pay

## 2021-06-24 ENCOUNTER — Ambulatory Visit: Payer: BC Managed Care – PPO | Admitting: Family Medicine

## 2021-06-24 VITALS — BP 134/78 | HR 94 | Temp 98.2°F | Resp 16 | Ht 67.0 in | Wt 266.0 lb

## 2021-06-24 DIAGNOSIS — Z23 Encounter for immunization: Secondary | ICD-10-CM

## 2021-06-24 DIAGNOSIS — E785 Hyperlipidemia, unspecified: Secondary | ICD-10-CM

## 2021-06-24 DIAGNOSIS — Z131 Encounter for screening for diabetes mellitus: Secondary | ICD-10-CM

## 2021-06-24 DIAGNOSIS — I10 Essential (primary) hypertension: Secondary | ICD-10-CM | POA: Diagnosis not present

## 2021-06-24 MED ORDER — AMLODIPINE BESYLATE 10 MG PO TABS: 10.0000 mg | ORAL_TABLET | Freq: Every day | ORAL | 1 refills | Status: DC

## 2021-06-24 MED ORDER — LISINOPRIL-HYDROCHLOROTHIAZIDE 20-12.5 MG PO TABS: 1.0000 | ORAL_TABLET | Freq: Two times a day (BID) | ORAL | 1 refills | Status: DC

## 2021-06-24 NOTE — Patient Instructions (Addendum)
No med changes today. Keep up the good work with exercise. Depending on cholesterol levels, could consider CT scan for coronary calcium scoring to help make decision on meds. Let me know if there are questions.   I do recommend new Covid booster.  [facility] is now offering the bivalent Covid booster vaccine. To schedule an appointment or to find more information, visit [email or web address] You can also call [phone], Monday through Friday, 7 a.m. to 7 p.m.

## 2021-06-24 NOTE — Progress Notes (Signed)
Subjective:  Patient ID: Kathy Barron, female    DOB: [birth year]  Age: 60 y.o. MRN: [id number]  CC:  Chief Complaint  Patient presents with   Hypertension    Pt here for recheck and due for refills amlodipine and lisinopril HCTZ,    Neck Pain    No action required pt reports neck pain is gone and she has stopped using the flexeril     HPI Kathy Barron presents for   Hypertension: Total dose lisinopril hct 40/25mg , and norvasc 10mg  qd.  Home readings: 130/70's.  No new med side effects. Min swelling in ankles, improves overnight.  Walking 32min at lunch few days per week, class at gym 1 hr 2 days per week.  Wt Readings from Last 3 Encounters:  06/24/21 266 lb (120.7 kg)  03/06/21 262 lb 6.4 oz (119 kg)  10/26/20 254 lb (115.2 kg)    BP Readings from Last 3 Encounters:  06/24/21 134/78  03/06/21 132/76  10/26/20 (!) 144/82   Lab Results  Component Value Date   CREATININE 0.81 10/26/2020    Hyperlipidemia: No direct FH of CAD. Maternal GF with heart disease.  The 10-year ASCVD risk score (Arnett DK, et al., 2019) is: 9.2%   Values used to calculate the score:     Age: 78 years     Sex: Female     Is Non-Hispanic African American: Yes     Diabetic: No     Tobacco smoker: No     Systolic Blood Pressure: 811 mmHg     Is BP treated: Yes     HDL Cholesterol: 49 mg/dL     Total Cholesterol: 214 mg/dL  Lab Results  Component Value Date   CHOL 214 (H) 10/26/2020   HDL 49 10/26/2020   LDLCALC 149 (H) 10/26/2020   TRIG 88 10/26/2020   CHOLHDL 4.4 10/26/2020   Lab Results  Component Value Date   ALT 13 10/26/2020   AST 19 10/26/2020   ALKPHOS 91 10/26/2020   BILITOT 0.6 10/26/2020   Body mass index is 41.66 kg/m. Wt Readings from Last 3 Encounters:  06/24/21 266 lb (120.7 kg)  03/06/21 262 lb 6.4 oz (119 kg)  10/26/20 254 lb (115.2 kg)   Lab Results  Component Value Date   HGBA1C 5.2 04/06/2020  Exercise as above.  Minimal soda, some take out  and  restaurant food. No regular fast food.  Minimal breakfast - trying to increase  Neck/shoulder pain from June has improved.  Thought to have component of radiculopathy at that time.  Treated with Flexeril, symptomatic care. Resolved in 2-3 weeks.   Due for updated booster.  Flu vaccine today. Deferred shingles vaccine for now  History Patient Active Problem List   Diagnosis Date Noted   Uterine leiomyoma 07/29/2018   Class 2 obesity due to excess calories without serious comorbidity with body mass index (BMI) of 37.0 to 37.9 in adult 07/27/2017   Essential hypertension 07/23/2016   Past Medical History:  Diagnosis Date   Hypertension    Past Surgical History:  Procedure Laterality Date   CHOLECYSTECTOMY  2009   TUBAL LIGATION     No Known Allergies Prior to Admission medications   Medication Sig Start Date End Date Taking? Authorizing Provider  amLODipine (NORVASC) 10 MG tablet Take 1 tablet (10 mg total) by mouth daily. 10/26/20  Yes Wendie Agreste, MD  lisinopril-hydrochlorothiazide (ZESTORETIC) 20-12.5 MG tablet Take 1 tablet by mouth 2 (two) times daily. 10/26/20  Yes Wendie Agreste, MD  Multiple Vitamins-Minerals (MULTIVITAMIN ADULT EXTRA C PO) multivitamin   Yes [provider]   Social History   Socioeconomic History   Marital status: Single    Spouse name: Not on file   Number of children: Not on file   Years of education: Not on file   Highest education Barron: Not on file  Occupational History   Not on file  Tobacco Use   Smoking status: Never   Smokeless tobacco: Never  Vaping Use   Vaping Use: Never used  Substance and Sexual Activity   Alcohol use: No   Drug use: No   Sexual activity: Yes  Other Topics Concern   Not on file  Social History Narrative   Not on file   Social Determinants of Health   Financial Resource Strain: Not on file  Food Insecurity: Not on file  Transportation Needs: Not on file  Physical Activity: Not on file   Stress: Not on file  Social Connections: Not on file  Intimate Partner Violence: Not on file    Review of Systems  Constitutional:  Negative for fatigue and unexpected weight change.  Respiratory:  Negative for chest tightness and shortness of breath.   Cardiovascular:  Negative for chest pain, palpitations and leg swelling.  Gastrointestinal:  Negative for abdominal pain and blood in stool.  Neurological:  Negative for dizziness, syncope, light-headedness and headaches.    Objective:   Vitals:   06/24/21 1451  BP: 134/78  Pulse: 94  Resp: 16  Temp: 98.2 F (36.8 C)  TempSrc: Temporal  SpO2: 97%  Weight: 266 lb (120.7 kg)  Height: 5\' 7"  (1.702 m)     Physical Exam Vitals reviewed.  Constitutional:      Appearance: Normal appearance. She is well-developed.  HENT:     Head: Normocephalic and atraumatic.  Eyes:     Conjunctiva/sclera: Conjunctivae normal.     Pupils: Pupils are equal, round, and reactive to light.  Neck:     Vascular: No carotid bruit.  Cardiovascular:     Rate and Rhythm: Normal rate and regular rhythm.     Heart sounds: Normal heart sounds.  Pulmonary:     Effort: Pulmonary effort is normal.     Breath sounds: Normal breath sounds.  Abdominal:     Palpations: Abdomen is soft. There is no pulsatile mass.     Tenderness: There is no abdominal tenderness.  Musculoskeletal:     Right lower leg: No edema.     Left lower leg: No edema.  Skin:    General: Skin is warm and dry.  Neurological:     Mental Status: She is alert and oriented to person, place, and time.  Psychiatric:        Mood and Affect: Mood normal.        Behavior: Behavior normal.       Assessment & Plan:  Kathy Barron is a 60 y.o. female . Essential hypertension - Plan: lisinopril-hydrochlorothiazide (ZESTORETIC) 20-12.5 MG tablet, amLODipine (NORVASC) 10 MG tablet, Comprehensive metabolic panel  -  Stable, tolerating current regimen. Medications refilled. Labs pending as  above.   Need for influenza vaccination - Plan: Flu Vaccine QUAD 6+ mos PF IM (Fluarix Quad PF)  Screening for diabetes mellitus - Plan: Hemoglobin A1c  Hyperlipidemia, unspecified hyperlipidemia type - Plan: Lipid panel  - commended on exercise. Check A1c, repeat lipid panel. Borderline ascvd score prior. Consider CCS to help decide on statin if still  borderline.   Meds ordered this encounter  Medications   lisinopril-hydrochlorothiazide (ZESTORETIC) 20-12.5 MG tablet    Sig: Take 1 tablet by mouth 2 (two) times daily.    Dispense:  180 tablet    Refill:  1   amLODipine (NORVASC) 10 MG tablet    Sig: Take 1 tablet (10 mg total) by mouth daily.    Dispense:  90 tablet    Refill:  1   Patient Instructions  No med changes today. Keep up the good work with exercise. Depending on cholesterol levels, could consider CT scan for coronary calcium scoring to help make decision on meds. Let me know if there are questions.   I do recommend new Covid booster.  [facility] is now offering the bivalent Covid booster vaccine. To schedule an appointment or to find more information, visit [email or web address] You can also call [phone], Monday through Friday, 7 a.m. to 7 p.m.    Signed,   Merri Ray, MD [place] 06/24/21 3:32 PM

## 2021-06-25 LAB — COMPREHENSIVE METABOLIC PANEL
ALT: 11 U/L (ref 0–35)
AST: 18 U/L (ref 0–37)
Albumin: 4.2 g/dL (ref 3.5–5.2)
Alkaline Phosphatase: 78 U/L (ref 39–117)
BUN: 14 mg/dL (ref 6–23)
CO2: 27 mEq/L (ref 19–32)
Calcium: 10 mg/dL (ref 8.4–10.5)
Chloride: 99 mEq/L (ref 96–112)
Creatinine, Ser: 1.1 mg/dL (ref 0.40–1.20)
GFR: 54.27 mL/min — ABNORMAL LOW (ref >60.00)
Glucose, Bld: 84 mg/dL (ref 70–99)
Potassium: 3.9 mEq/L (ref 3.5–5.1)
Sodium: 138 mEq/L (ref 135–145)
Total Bilirubin: 0.8 mg/dL (ref 0.2–1.2)
Total Protein: 7.3 g/dL (ref 6.0–8.3)

## 2021-06-25 LAB — LIPID PANEL
Cholesterol: 205 mg/dL — ABNORMAL HIGH (ref 0–200)
HDL: 52.4 mg/dL (ref >39.00)
LDL Cholesterol: 131 mg/dL — ABNORMAL HIGH (ref 0–99)
NonHDL: 152.25
Total CHOL/HDL Ratio: 4
Triglycerides: 106 mg/dL (ref 0.0–149.0)
VLDL: 21.2 mg/dL (ref 0.0–40.0)

## 2021-06-25 LAB — HEMOGLOBIN A1C: Hgb A1c MFr Bld: 5.4 % (ref 4.6–6.5)

## 2022-01-01 ENCOUNTER — Encounter: Payer: BC Managed Care – PPO | Admitting: Family Medicine

## 2022-02-03 ENCOUNTER — Ambulatory Visit (INDEPENDENT_AMBULATORY_CARE_PROVIDER_SITE_OTHER): Payer: BC Managed Care – PPO | Admitting: Family Medicine

## 2022-02-03 ENCOUNTER — Encounter: Payer: Self-pay | Admitting: Family Medicine

## 2022-02-03 VITALS — BP 138/78 | HR 86 | Temp 98.3°F | Resp 16 | Ht 67.0 in | Wt 262.2 lb

## 2022-02-03 DIAGNOSIS — Z0000 Encounter for general adult medical examination without abnormal findings: Secondary | ICD-10-CM | POA: Diagnosis not present

## 2022-02-03 DIAGNOSIS — Z131 Encounter for screening for diabetes mellitus: Secondary | ICD-10-CM | POA: Diagnosis not present

## 2022-02-03 DIAGNOSIS — E785 Hyperlipidemia, unspecified: Secondary | ICD-10-CM

## 2022-02-03 DIAGNOSIS — E042 Nontoxic multinodular goiter: Secondary | ICD-10-CM

## 2022-02-03 DIAGNOSIS — Z23 Encounter for immunization: Secondary | ICD-10-CM | POA: Diagnosis not present

## 2022-02-03 NOTE — Patient Instructions (Signed)
No med changes at this time.  I will let you know if there are any concerns on labs.  Keep up the good work with watching diet, exercise and recheck in 6 months.  Repeat shingles vaccine at that time.  Let me know if there are any questions. ?Fasting lab visit: ? ?[facility] ?Walk in 8:30-4:30 during weekdays, no appointment needed ?[address]  ?[place], [place] [address] ? ? ?Preventive Care 62-62 Years Old, Female ?Preventive care refers to lifestyle choices and visits with your health care provider that can promote health and wellness. Preventive care visits are also called wellness exams. ?What can I expect for my preventive care visit? ?Counseling ?Your health care provider may ask you questions about your: ?Medical history, including: ?Past medical problems. ?Family medical history. ?Pregnancy history. ?Current health, including: ?Menstrual cycle. ?Method of birth control. ?Emotional well-being. ?Home life and relationship well-being. ?Sexual activity and sexual health. ?Lifestyle, including: ?Alcohol, nicotine or tobacco, and drug use. ?Access to firearms. ?Diet, exercise, and sleep habits. ?Work and work Statistician. ?Sunscreen use. ?Safety issues such as seatbelt and bike helmet use. ?Physical exam ?Your health care provider will check your: ?Height and weight. These may be used to calculate your BMI (body mass index). BMI is a measurement that tells if you are at a healthy weight. ?Waist circumference. This measures the distance around your waistline. This measurement also tells if you are at a healthy weight and may help predict your risk of certain diseases, such as type 2 diabetes and high blood pressure. ?Heart rate and blood pressure. ?Body temperature. ?Skin for abnormal spots. ?What immunizations do I need? ? ?Vaccines are usually given at various ages, according to a schedule. Your health care provider will recommend vaccines for you based on your age, medical history, and lifestyle or other  factors, such as travel or where you work. ?What tests do I need? ?Screening ?Your health care provider may recommend screening tests for certain conditions. This may include: ?Lipid and cholesterol levels. ?Diabetes screening. This is done by checking your blood sugar (glucose) after you have not eaten for a while (fasting). ?Pelvic exam and Pap test. ?Hepatitis B test. ?Hepatitis C test. ?HIV (human immunodeficiency virus) test. ?STI (sexually transmitted infection) testing, if you are at risk. ?Lung cancer screening. ?Colorectal cancer screening. ?Mammogram. Talk with your health care provider about when you should start having regular mammograms. This may depend on whether you have a family history of breast cancer. ?BRCA-related cancer screening. This may be done if you have a family history of breast, ovarian, tubal, or peritoneal cancers. ?Bone density scan. This is done to screen for osteoporosis. ?Talk with your health care provider about your test results, treatment options, and if necessary, the need for more tests. ?Follow these instructions at home: ?Eating and drinking ? ?Eat a diet that includes fresh fruits and vegetables, whole grains, lean protein, and low-fat dairy products. ?Take vitamin and mineral supplements as recommended by your health care provider. ?Do not drink alcohol if: ?Your health care provider tells you not to drink. ?You are pregnant, may be pregnant, or are planning to become pregnant. ?If you drink alcohol: ?Limit how much you have to 0-1 drink a day. ?Know how much alcohol is in your drink. In the [place] one drink equals one 12 oz bottle of beer (355 mL), one 5 oz glass of wine (148 mL), or one 1? oz glass of hard liquor (44 mL). ?Lifestyle ?Brush your teeth every morning  and night with fluoride toothpaste. Floss one time each day. ?Exercise for at least 30 minutes 5 or more days each week. ?Do not use any products that contain nicotine or tobacco. These products include  cigarettes, chewing tobacco, and vaping devices, such as e-cigarettes. If you need help quitting, ask your health care provider. ?Do not use drugs. ?If you are sexually active, practice safe sex. Use a condom or other form of protection to prevent STIs. ?If you do not wish to become pregnant, use a form of birth control. If you plan to become pregnant, see your health care provider for a prepregnancy visit. ?Take aspirin only as told by your health care provider. Make sure that you understand how much to take and what form to take. Work with your health care provider to find out whether it is safe and beneficial for you to take aspirin daily. ?Find healthy ways to manage stress, such as: ?Meditation, yoga, or listening to music. ?Journaling. ?Talking to a trusted person. ?Spending time with friends and family. ?Minimize exposure to UV radiation to reduce your risk of skin cancer. ?Safety ?Always wear your seat belt while driving or riding in a vehicle. ?Do not drive: ?If you have been drinking alcohol. Do not ride with someone who has been drinking. ?When you are tired or distracted. ?While texting. ?If you have been using any mind-altering substances or drugs. ?Wear a helmet and other protective equipment during sports activities. ?If you have firearms in your house, make sure you follow all gun safety procedures. ?Seek help if you have been physically or sexually abused. ?What's next? ?Visit your health care provider once a year for an annual wellness visit. ?Ask your health care provider how often you should have your eyes and teeth checked. ?Stay up to date on all vaccines. ?This information is not intended to replace advice given to you by your health care provider. Make sure you discuss any questions you have with your health care provider. ?Document Revised: 03/06/2021 Document Reviewed: 03/06/2021 ?Elsevier Patient Education ? [place]. ? ?

## 2022-02-03 NOTE — Progress Notes (Signed)
? ?Subjective:  ?Patient ID: Kathy Barron, female    DOB: [birth year]  Age: 61 y.o. MRN: [id number] ? ?CC:  ?Chief Complaint  ?Patient presents with  ? Annual Exam  ?  Pt here for annual exam, no concerns, notes does need refills, pt is not fasting currently   ? ? ?HPI ?Kathy Barron presents for Annual Exam ? ?Hypertension: ?Treated with amlodipine 10 mg daily, lisinopril HCTZ 20/12.5 mg daily. ?No new side effects. Rare ankle swelling, quickly resolves.  ?Home readings:130/70 range.  ?BP Readings from Last 3 Encounters:  ?02/03/22 138/78  ?06/24/21 134/78  ?03/06/21 132/76  ? ?Lab Results  ?Component Value Date  ? CREATININE 1.10 06/24/2021  ? ?Prior multinodular goiter with reassuring biopsy in 2014  ?No new symptoms.  ?Lab Results  ?Component Value Date  ? TSH 1.830 04/06/2020  ? ? ? ?  02/03/2022  ?  3:29 PM 06/24/2021  ?  2:54 PM 03/06/2021  ?  4:13 PM 10/26/2020  ?  2:01 PM 04/06/2020  ? 11:48 AM  ?Depression screen PHQ 2/9  ?Decreased Interest 0 0 0 0 0  ?Down, Depressed, Hopeless 0 0 0 0 0  ?PHQ - 2 Score 0 0 0 0 0  ?Altered sleeping   0    ?Tired, decreased energy   0    ?Change in appetite   0    ?Feeling bad or failure about yourself    0    ?Trouble concentrating   0    ?Moving slowly or fidgety/restless   0    ?Suicidal thoughts   0    ?PHQ-9 Score   0    ? ? ?Health Maintenance  ?Topic Date Due  ? Zoster Vaccines- Shingrix (1 of 2) Never done  ? COVID-19 Vaccine (3 - Booster for Moderna series) 02/19/2022 (Originally 10/08/2020)  ? Hepatitis C Screening  06/24/2022 (Originally 01/29/1979)  ? MAMMOGRAM  02/04/2023 (Originally 07/25/2021)  ? INFLUENZA VACCINE  04/22/2022  ? PAP SMEAR-Modifier  07/25/2022  ? COLONOSCOPY (Pts 45-24yr Insurance coverage will need to be confirmed)  04/18/2024  ? TETANUS/TDAP  07/28/2027  ? HIV Screening  Completed  ? HPV VACCINES  Aged Out  ?Colonoscopy 2020 - repeat 5 yrs.  ?Mammogram last May, at GYN - normal. Appt pending next few months.  ?Pap last year normal - appt pending as  above.  ? ?Immunization History  ?Administered Date(s) Administered  ? Influenza,inj,Quad PF,6+ Mos 07/20/2013, 07/03/2015, 07/23/2016, 07/27/2017, 07/29/2018, 08/01/2019, 07/26/2020, 06/24/2021  ? Moderna SARS-COV2 Booster Vaccination 08/13/2020  ? Moderna Sars-Covid-2 Vaccination 12/01/2019, 01/03/2020  ? Tdap 07/27/2017  ?Covid booster - 06/2021. Considering repeat booster.  ?Shingrix vaccine today.  ? ?No results found. ?Wears glasses - due for visit, saw about a year ago. No new vision changes.no glaucoma/MD.  ? ?Dental:Yes and Within Last 6 months appt this week. 3 month intervals.  ? ?Alcohol:none.  ? ?Tobacco: none.  ? ?Exercise/obesity: ?Body mass index is 41.07 kg/m?. ?Wt Readings from Last 3 Encounters:  ?02/03/22 262 lb 3.2 oz (118.9 kg)  ?06/24/21 266 lb (120.7 kg)  ?03/06/21 262 lb 6.4 oz (119 kg)  ?Weigh down from last visit. Was walking more and some exercise at gym, break, now getting back into routine since [employer] graduation - more time during the summer.  ? ?Lab Results  ?Component Value Date  ? HGBA1C 5.4 06/24/2021  ? ?History ?Patient Active Problem List  ? Diagnosis Date Noted  ? Uterine leiomyoma 07/29/2018  ? Class 2 obesity  due to excess calories without serious comorbidity with body mass index (BMI) of 37.0 to 37.9 in adult 07/27/2017  ? Essential hypertension 07/23/2016  ? ?Past Medical History:  ?Diagnosis Date  ? Hypertension   ? ?Past Surgical History:  ?Procedure Laterality Date  ? CHOLECYSTECTOMY  2009  ? TUBAL LIGATION    ? ?No Known Allergies ?Prior to Admission medications   ?Medication Sig Start Date End Date Taking? Authorizing Provider  ?amLODipine (NORVASC) 10 MG tablet Take 1 tablet (10 mg total) by mouth daily. 06/24/21  Yes Wendie Agreste, MD  ?lisinopril-hydrochlorothiazide (ZESTORETIC) 20-12.5 MG tablet Take 1 tablet by mouth 2 (two) times daily. 06/24/21  Yes Wendie Agreste, MD  ?Multiple Vitamins-Minerals (MULTIVITAMIN ADULT EXTRA C PO) multivitamin   Yes [provider]  ? ?Social History  ? ?Socioeconomic History  ? Marital status: Single  ?  Spouse name: Not on file  ? Number of children: Not on file  ? Years of education: Not on file  ? Highest education level: Not on file  ?Occupational History  ? Not on file  ?Tobacco Use  ? Smoking status: Never  ? Smokeless tobacco: Never  ?Vaping Use  ? Vaping Use: Never used  ?Substance and Sexual Activity  ? Alcohol use: No  ? Drug use: No  ? Sexual activity: Yes  ?Other Topics Concern  ? Not on file  ?Social History Narrative  ? Not on file  ? ?Social Determinants of Health  ? ?Financial Resource Strain: Not on file  ?Food Insecurity: Not on file  ?Transportation Needs: Not on file  ?Physical Activity: Not on file  ?Stress: Not on file  ?Social Connections: Not on file  ?Intimate Partner Violence: Not on file  ? ? ?Review of Systems ? ? ?Objective:  ? ?Vitals:  ? 02/03/22 1526  ?BP: 138/78  ?Pulse: 86  ?Resp: 16  ?Temp: 98.3 ?F (36.8 ?C)  ?TempSrc: Temporal  ?SpO2: 97%  ?Weight: 262 lb 3.2 oz (118.9 kg)  ?Height: '5\' 7"'$  (1.702 m)  ? ? ? ?Physical Exam ?Vitals reviewed.  ?Constitutional:   ?   Appearance: She is well-developed.  ?HENT:  ?   Head: Normocephalic and atraumatic.  ?   Right Ear: External ear normal.  ?   Left Ear: External ear normal.  ?Eyes:  ?   Conjunctiva/sclera: Conjunctivae normal.  ?   Pupils: Pupils are equal, round, and reactive to light.  ?Neck:  ?   Thyroid: No thyromegaly.  ?   Comments: Possible slight fullness without appreciable focal nodule, nontender. ?Cardiovascular:  ?   Rate and Rhythm: Normal rate and regular rhythm.  ?   Heart sounds: Normal heart sounds. No murmur heard. ?Pulmonary:  ?   Effort: Pulmonary effort is normal. No respiratory distress.  ?   Breath sounds: Normal breath sounds. No wheezing.  ?Abdominal:  ?   General: Bowel sounds are normal.  ?   Palpations: Abdomen is soft.  ?   Tenderness: There is no abdominal tenderness.  ?Musculoskeletal:     ?   General: No tenderness.  Normal range of motion.  ?   Cervical back: Normal range of motion and neck supple.  ?Lymphadenopathy:  ?   Cervical: No cervical adenopathy.  ?Skin: ?   General: Skin is warm and dry.  ?   Findings: No rash.  ?Neurological:  ?   Mental Status: She is alert and oriented to person, place, and time.  ?Psychiatric:     ?  Behavior: Behavior normal.     ?   Thought Content: Thought content normal.  ? ? ? ? ? ?Assessment & Plan:  ?Kathy Barron is a 61 y.o. female . ?Annual physical exam ?- -anticipatory guidance as below in AVS, screening labs above. Health maintenance items as above in HPI discussed/recommended as applicable.  ? -Check screening labs with CMP, lipids, TSH.  Commended on exercise and weight loss, continue with diet/exercise approach for obesity treatment. ? -Hypertension controlled, continue same regimen. ? -Check lipids and discuss medication changes accordingly if needed. ? ?Screening for diabetes mellitus ? ?Hyperlipidemia, unspecified hyperlipidemia type ? ?Need for shingles vaccine ? ?History of multinodular goiter ?Check TSH. ? ? ?No orders of the defined types were placed in this encounter. ? ?There are no Patient Instructions on file for this visit. ? ? ? ?Signed,  ? ?Merri Ray, MD ?[place] ?[facility] ?02/03/22 ?4:03 PM ? ? ? ?

## 2022-02-25 ENCOUNTER — Other Ambulatory Visit: Payer: Self-pay | Admitting: Family Medicine

## 2022-02-25 DIAGNOSIS — I10 Essential (primary) hypertension: Secondary | ICD-10-CM

## 2022-03-18 ENCOUNTER — Other Ambulatory Visit: Payer: Self-pay | Admitting: Family Medicine

## 2022-03-18 DIAGNOSIS — I10 Essential (primary) hypertension: Secondary | ICD-10-CM

## 2022-06-11 LAB — HM MAMMOGRAPHY

## 2022-07-03 ENCOUNTER — Ambulatory Visit (INDEPENDENT_AMBULATORY_CARE_PROVIDER_SITE_OTHER): Payer: BC Managed Care – PPO | Admitting: Family Medicine

## 2022-07-03 ENCOUNTER — Encounter: Payer: Self-pay | Admitting: Family Medicine

## 2022-07-03 VITALS — BP 138/78 | HR 78 | Temp 98.3°F | Ht 67.0 in | Wt 266.6 lb

## 2022-07-03 DIAGNOSIS — Z23 Encounter for immunization: Secondary | ICD-10-CM

## 2022-07-03 DIAGNOSIS — M25562 Pain in left knee: Secondary | ICD-10-CM | POA: Diagnosis not present

## 2022-07-03 MED ORDER — MELOXICAM 7.5 MG PO TABS: 7.5000 mg | ORAL_TABLET | Freq: Every day | ORAL | 0 refills | Status: DC

## 2022-07-03 NOTE — Progress Notes (Signed)
Subjective:  Patient ID: Kathy Barron, female    DOB: 10-16-1960  Age: 61 y.o. MRN: [id number]  CC:  Chief Complaint  Patient presents with   Knee Pain    Pt states her left knee is hurting, pt states it has been hurting for a few weeks, she has been doing boot camp , pt has been taking alieve , icy hot, brace, pt states its really stiff and achy in the morning and sore to the touch on the left side     HPI Kathy Barron presents for   Left knee pain Past 2-3 weeks.  Has been involved with [facility] exercise program., pain noted after. No one specific injury. Stiff, achy in the morning, sore on the outside, and sore with steps.  No giving way or locking. No prior left knee surgery, injection. More persistent pain.,   Attempted treatments, Aleve once per day. , IcyHot, over-the-counter brace with exercise. Still able to exercise some with adjustment in regimen.  Chart reviewed, no prior left knee x-rays seen but right knee x-ray in 2020 with prominent arthritis in patellofemoral compartment.  Immunization History  Administered Date(s) Administered   Influenza Inj Mdck Quad Pf 06/05/2022   Influenza,inj,Quad PF,6+ Mos 07/20/2013, 07/03/2015, 07/23/2016, 07/27/2017, 07/29/2018, 08/01/2019, 07/26/2020, 06/24/2021   Moderna SARS-COV2 Booster Vaccination 08/13/2020   Moderna Sars-Covid-2 Vaccination 12/01/2019, 01/03/2020   Tdap 07/27/2017   Zoster Recombinat (Shingrix) 02/03/2022  Second Shingrix today.   History Patient Active Problem List   Diagnosis Date Noted   Uterine leiomyoma 07/29/2018   Class 2 obesity due to excess calories without serious comorbidity with body mass index (BMI) of 37.0 to 37.9 in adult 07/27/2017   Essential hypertension 07/23/2016   Past Medical History:  Diagnosis Date   Hypertension    Past Surgical History:  Procedure Laterality Date   CHOLECYSTECTOMY  2009   TUBAL LIGATION     No Known Allergies Prior to Admission medications   Medication  Sig Start Date End Date Taking? Authorizing Provider  amLODipine (NORVASC) 10 MG tablet TAKE 1 TABLET(10 MG) BY MOUTH DAILY 02/25/22  Yes Wendie Agreste, MD  lisinopril-hydrochlorothiazide (ZESTORETIC) 20-12.5 MG tablet TAKE 1 TABLET BY MOUTH TWICE DAILY 03/19/22  Yes Wendie Agreste, MD  Multiple Vitamins-Minerals (MULTIVITAMIN ADULT EXTRA C PO) multivitamin   Yes [provider]  lisinopril-hydrochlorothiazide (ZESTORETIC) 20-12.5 MG tablet Take 1 tablet by mouth 2 (two) times daily.    [provider]   Social History   Socioeconomic History   Marital status: Single    Spouse name: Not on file   Number of children: Not on file   Years of education: Not on file   Highest education level: Not on file  Occupational History   Not on file  Tobacco Use   Smoking status: Never   Smokeless tobacco: Never  Vaping Use   Vaping Use: Never used  Substance and Sexual Activity   Alcohol use: No   Drug use: No   Sexual activity: Yes  Other Topics Concern   Not on file  Social History Narrative   Not on file   Social Determinants of Health   Financial Resource Strain: Not on file  Food Insecurity: Not on file  Transportation Needs: Not on file  Physical Activity: Not on file  Stress: Not on file  Social Connections: Not on file  Intimate Partner Violence: Not on file    Review of Systems Per HPI.   Objective:   Vitals:  07/03/22 1101  BP: 138/78  Pulse: 78  Temp: 98.3 F (36.8 C)  SpO2: 100%  Weight: 266 lb 9.6 oz (120.9 kg)  Height: '5\' 7"'$  (1.702 m)    Physical Exam Constitutional:      General: She is not in acute distress.    Appearance: Normal appearance. She is well-developed.  HENT:     Head: Normocephalic and atraumatic.  Cardiovascular:     Rate and Rhythm: Normal rate.  Pulmonary:     Effort: Pulmonary effort is normal.  Musculoskeletal:     Comments: Left knee, skin intact, no apparent erythema or wounds.  Difficult to assess  effusion based on body habitus but no appreciable difference from right knee.  Full extension, discomfort with flexion beyond 90 degrees.  Tender palpation over the lateral joint line and towards anterior knee, still lateral joint line.  Patella nontender.  No focal medial joint line tenderness.  Negative varus/valgus testing.  Somewhat guarded [place].  Negative drawer.  Neurovascular intact distally.  Neurological:     Mental Status: She is alert and oriented to person, place, and time.  Psychiatric:        Mood and Affect: Mood normal.        Assessment & Plan:  Kathy Barron is a 61 y.o. female . Acute pain of left knee - Plan: meloxicam (MOBIC) 7.5 MG tablet, DG Knee Complete 4 Views Left  Need for shingles vaccine - Plan: Varicella-zoster vaccine IM  Few week history of knee pain as above without 1 specific injury.  Suspect new exercise may have caused flare, possible underlying degenerative joint disease versus degenerative meniscal disease.  No mechanical symptoms at this time.  Initially recommended activity modification, over-the-counter brace as well as heat or ice as needed, start meloxicam with potential side effects discussed, short-term use recommended.  Check imaging, update on symptoms in the next 1 to 2 weeks with option of Ortho eval.  RTC precautions if worse.  Second shingles vaccine given.  Meds ordered this encounter  Medications   meloxicam (MOBIC) 7.5 MG tablet    Sig: Take 1-2 tablets (7.5-15 mg total) by mouth daily.    Dispense:  30 tablet    Refill:  0   Patient Instructions  Please have x-ray performed at the [facility] location below.  Okay to continue to use ice or heat over affected areas, and okay to use over-the-counter brace with activity but avoid jumping, twisting activities for now.  Meloxicam 1 to 2/day, use lowest effective dose and do not combine with over-the-counter anti-inflammatories.  See other information below.  Give me an update in the  next week or 2 and if not significantly improved I will refer you to orthopedics.  Take care  Acute Knee Pain, Adult Acute knee pain is sudden and may be caused by damage, swelling, or irritation of the muscles and tissues that support the knee. Pain may result from: A fall. An injury to the knee from twisting motions. A hit to the knee. Infection. Acute knee pain may go away on its own with time and rest. If it does not, your health care provider may order tests to find the cause of the pain. These may include: Imaging tests, such as an X-ray, MRI, CT scan, or ultrasound. Joint aspiration. In this test, fluid is removed from the knee and evaluated. Arthroscopy. In this test, a lighted tube is inserted into the knee and an image is projected onto a TV screen. Biopsy. In  this test, a sample of tissue is removed from the body and studied under a microscope. Follow these instructions at home: If you have a knee sleeve or brace:  Wear the knee sleeve or brace as told by your health care provider. Remove it only as told by your health care provider. Loosen it if your toes tingle, become numb, or turn cold and blue. Keep it clean. If the knee sleeve or brace is not waterproof: Do not let it get wet. Cover it with a watertight covering when you take a bath or shower. Activity Rest your knee. Do not do things that cause pain or make pain worse. Avoid high-impact activities or exercises, such as running, jumping rope, or doing jumping jacks. Work with a physical therapist to make a safe exercise program, as recommended by your health care provider. Do exercises as told by your physical therapist. Managing pain, stiffness, and swelling  If directed, put ice on the affected knee. To do this: If you have a removable knee sleeve or brace, remove it as told by your health care provider. Put ice in a plastic bag. Place a towel between your skin and the bag. Leave the ice on for 20 minutes, 2-3  times a day. Remove the ice if your skin turns bright red. This is very important. If you cannot feel pain, heat, or cold, you have a greater risk of damage to the area. If directed, use an elastic bandage to put pressure (compression) on your injured knee. This may control swelling, give support, and help with discomfort. Raise (elevate) your knee above the level of your heart while you are sitting or lying down. Sleep with a pillow under your knee. General instructions Take over-the-counter and prescription medicines only as told by your health care provider. Do not use any products that contain nicotine or tobacco, such as cigarettes, e-cigarettes, and chewing tobacco. If you need help quitting, ask your health care provider. If you are overweight, work with your health care provider and a dietitian to set a weight-loss goal that is healthy and reasonable for you. Extra weight can put pressure on your knee. Pay attention to any changes in your symptoms. Keep all follow-up visits. This is important. Contact a health care provider if: Your knee pain continues, changes, or gets worse. You have a fever along with knee pain. Your knee feels warm to the touch or is red. Your knee buckles or locks up. Get help right away if: Your knee swells, and the swelling becomes worse. You cannot move your knee. You have severe pain in your knee that cannot be managed with pain medicine. Summary Acute knee pain can be caused by a fall, an injury, an infection, or damage, swelling, or irritation of the tissues that support your knee. Your health care provider may perform tests to find out the cause of the pain. Pay attention to any changes in your symptoms. Relieve your pain with rest, medicines, light activity, and the use of ice. Get help right away if your knee swells, you cannot move your knee, or you have severe pain that cannot be managed with medicine. This information is not intended to replace  advice given to you by your health care provider. Make sure you discuss any questions you have with your health care provider. Document Revised: 02/22/2020 Document Reviewed: 02/22/2020 Elsevier Patient Education  2023 [place],   Merri Ray, MD [facility] [place]  [facility] 07/03/22 11:42 AM

## 2022-07-03 NOTE — Patient Instructions (Signed)
Please have x-ray performed at the [facility] location below.  Okay to continue to use ice or heat over affected areas, and okay to use over-the-counter brace with activity but avoid jumping, twisting activities for now.  Meloxicam 1 to 2/day, use lowest effective dose and do not combine with over-the-counter anti-inflammatories.  See other information below.  Give me an update in the next week or 2 and if not significantly improved I will refer you to orthopedics.  Take care  Acute Knee Pain, Adult Acute knee pain is sudden and may be caused by damage, swelling, or irritation of the muscles and tissues that support the knee. Pain may result from: A fall. An injury to the knee from twisting motions. A hit to the knee. Infection. Acute knee pain may go away on its own with time and rest. If it does not, your health care provider may order tests to find the cause of the pain. These may include: Imaging tests, such as an X-ray, MRI, CT scan, or ultrasound. Joint aspiration. In this test, fluid is removed from the knee and evaluated. Arthroscopy. In this test, a lighted tube is inserted into the knee and an image is projected onto a TV screen. Biopsy. In this test, a sample of tissue is removed from the body and studied under a microscope. Follow these instructions at home: If you have a knee sleeve or brace:  Wear the knee sleeve or brace as told by your health care provider. Remove it only as told by your health care provider. Loosen it if your toes tingle, become numb, or turn cold and blue. Keep it clean. If the knee sleeve or brace is not waterproof: Do not let it get wet. Cover it with a watertight covering when you take a bath or shower. Activity Rest your knee. Do not do things that cause pain or make pain worse. Avoid high-impact activities or exercises, such as running, jumping rope, or doing jumping jacks. Work with a physical therapist to make a safe exercise program, as  recommended by your health care provider. Do exercises as told by your physical therapist. Managing pain, stiffness, and swelling  If directed, put ice on the affected knee. To do this: If you have a removable knee sleeve or brace, remove it as told by your health care provider. Put ice in a plastic bag. Place a towel between your skin and the bag. Leave the ice on for 20 minutes, 2-3 times a day. Remove the ice if your skin turns bright red. This is very important. If you cannot feel pain, heat, or cold, you have a greater risk of damage to the area. If directed, use an elastic bandage to put pressure (compression) on your injured knee. This may control swelling, give support, and help with discomfort. Raise (elevate) your knee above the level of your heart while you are sitting or lying down. Sleep with a pillow under your knee. General instructions Take over-the-counter and prescription medicines only as told by your health care provider. Do not use any products that contain nicotine or tobacco, such as cigarettes, e-cigarettes, and chewing tobacco. If you need help quitting, ask your health care provider. If you are overweight, work with your health care provider and a dietitian to set a weight-loss goal that is healthy and reasonable for you. Extra weight can put pressure on your knee. Pay attention to any changes in your symptoms. Keep all follow-up visits. This is important. Contact a health care provider  if: Your knee pain continues, changes, or gets worse. You have a fever along with knee pain. Your knee feels warm to the touch or is red. Your knee buckles or locks up. Get help right away if: Your knee swells, and the swelling becomes worse. You cannot move your knee. You have severe pain in your knee that cannot be managed with pain medicine. Summary Acute knee pain can be caused by a fall, an injury, an infection, or damage, swelling, or irritation of the tissues that support  your knee. Your health care provider may perform tests to find out the cause of the pain. Pay attention to any changes in your symptoms. Relieve your pain with rest, medicines, light activity, and the use of ice. Get help right away if your knee swells, you cannot move your knee, or you have severe pain that cannot be managed with medicine. This information is not intended to replace advice given to you by your health care provider. Make sure you discuss any questions you have with your health care provider. Document Revised: 02/22/2020 Document Reviewed: 02/22/2020 Elsevier Patient Education  [place].

## 2022-07-08 ENCOUNTER — Ambulatory Visit (INDEPENDENT_AMBULATORY_CARE_PROVIDER_SITE_OTHER)
Admission: RE | Admit: 2022-07-08 | Discharge: 2022-07-08 | Disposition: A | Discharge status: Home / Self Care | Payer: BC Managed Care – PPO | Source: Ambulatory Visit | Attending: Family Medicine | Admitting: Family Medicine

## 2022-07-08 DIAGNOSIS — M25562 Pain in left knee: Secondary | ICD-10-CM

## 2022-08-06 ENCOUNTER — Ambulatory Visit (INDEPENDENT_AMBULATORY_CARE_PROVIDER_SITE_OTHER): Payer: BC Managed Care – PPO | Admitting: Family Medicine

## 2022-08-06 ENCOUNTER — Encounter: Payer: Self-pay | Admitting: Family Medicine

## 2022-08-06 VITALS — BP 140/78 | HR 81 | Temp 98.3°F | Ht 67.0 in | Wt 262.2 lb

## 2022-08-06 DIAGNOSIS — M25562 Pain in left knee: Secondary | ICD-10-CM | POA: Diagnosis not present

## 2022-08-06 DIAGNOSIS — I10 Essential (primary) hypertension: Secondary | ICD-10-CM | POA: Diagnosis not present

## 2022-08-06 DIAGNOSIS — E785 Hyperlipidemia, unspecified: Secondary | ICD-10-CM

## 2022-08-06 DIAGNOSIS — M1712 Unilateral primary osteoarthritis, left knee: Secondary | ICD-10-CM | POA: Diagnosis not present

## 2022-08-06 MED ORDER — AMLODIPINE BESYLATE 10 MG PO TABS: ORAL_TABLET | ORAL | 1 refills | Status: DC

## 2022-08-06 MED ORDER — LISINOPRIL-HYDROCHLOROTHIAZIDE 20-12.5 MG PO TABS: 1.0000 | ORAL_TABLET | Freq: Two times a day (BID) | ORAL | 1 refills | Status: DC

## 2022-08-06 MED ORDER — MELOXICAM 7.5 MG PO TABS: 7.5000 mg | ORAL_TABLET | Freq: Every day | ORAL | 0 refills | Status: DC

## 2022-08-06 NOTE — Progress Notes (Signed)
Subjective:  Patient ID: Kathy Barron, female    DOB: 03/21/1961  Age: 61 y.o. MRN: [id number]  CC:  Chief Complaint  Patient presents with   Hypertension    Pt states all is well    HPI Kathy Barron presents for   Hypertension: Amlodipine 10 mg daily, lisinopril HCTZ 20/12.5 mg - 2 per day.  Home readings: 130/70 - doing well. 2 meetings this morning.  BP Readings from Last 3 Encounters:  08/06/22 (!) 140/78  07/03/22 138/78  02/03/22 138/78   Lab Results  Component Value Date   CREATININE 1.10 06/24/2021   Hyperlipidemia: The 10-year ASCVD risk score (Arnett DK, et al., 2019) is: 10.2%   Values used to calculate the score:     Age: 67 years     Sex: Female     Is Non-Hispanic African American: Yes     Diabetic: No     Tobacco smoker: No     Systolic Blood Pressure: 462 mmHg     Is BP treated: Yes     HDL Cholesterol: 52.4 mg/dL     Total Cholesterol: 205 mg/dL No current meds. Fasting today. No FH of early CAD.  Lab Results  Component Value Date   CHOL 205 (H) 06/24/2021   HDL 52.40 06/24/2021   LDLCALC 131 (H) 06/24/2021   TRIG 106.0 06/24/2021   CHOLHDL 4 06/24/2021   Lab Results  Component Value Date   ALT 11 06/24/2021   AST 18 06/24/2021   ALKPHOS 78 06/24/2021   BILITOT 0.8 06/24/2021      Left knee pain Evaluated October 12.  Treated with meloxicam 7.5 mg 1 to 2/day.  Moderate to marked tricompartmental osteoarthritic changes on knee x-ray. Medicine helped. Still sore at times, still going to workouts. Stiff in morning. Just ran out.  Would like to try another shor course of mobic, then possible ortho eval.   History Patient Active Problem List   Diagnosis Date Noted   Uterine leiomyoma 07/29/2018   Class 2 obesity due to excess calories without serious comorbidity with body mass index (BMI) of 37.0 to 37.9 in adult 07/27/2017   Essential hypertension 07/23/2016   Past Medical History:  Diagnosis Date   Hypertension    Past Surgical  History:  Procedure Laterality Date   CHOLECYSTECTOMY  2009   TUBAL LIGATION     No Known Allergies Prior to Admission medications   Medication Sig Start Date End Date Taking? Authorizing Provider  amLODipine (NORVASC) 10 MG tablet TAKE 1 TABLET(10 MG) BY MOUTH DAILY 02/25/22  Yes Wendie Agreste, MD  lisinopril-hydrochlorothiazide (ZESTORETIC) 20-12.5 MG tablet TAKE 1 TABLET BY MOUTH TWICE DAILY 03/19/22  Yes Wendie Agreste, MD  meloxicam (MOBIC) 7.5 MG tablet Take 1-2 tablets (7.5-15 mg total) by mouth daily. 07/03/22  Yes Wendie Agreste, MD  Multiple Vitamins-Minerals (MULTIVITAMIN ADULT EXTRA C PO) multivitamin   Yes [provider]  lisinopril-hydrochlorothiazide (ZESTORETIC) 20-12.5 MG tablet Take 1 tablet by mouth 2 (two) times daily. Patient not taking: Reported on 08/06/2022    [provider]   Social History   Socioeconomic History   Marital status: Single    Spouse name: Not on file   Number of children: Not on file   Years of education: Not on file   Highest education level: Not on file  Occupational History   Not on file  Tobacco Use   Smoking status: Never   Smokeless tobacco: Never  Vaping Use  Vaping Use: Never used  Substance and Sexual Activity   Alcohol use: No   Drug use: No   Sexual activity: Yes  Other Topics Concern   Not on file  Social History Narrative   Not on file   Social Determinants of Health   Financial Resource Strain: Not on file  Food Insecurity: Not on file  Transportation Needs: Not on file  Physical Activity: Not on file  Stress: Not on file  Social Connections: Not on file  Intimate Partner Violence: Not on file    Review of Systems  Constitutional:  Negative for fatigue and unexpected weight change.  Respiratory:  Negative for chest tightness and shortness of breath.   Cardiovascular:  Negative for chest pain, palpitations and leg swelling.  Gastrointestinal:  Negative for abdominal pain and blood  in stool.  Neurological:  Negative for dizziness, syncope, light-headedness and headaches.     Objective:   Vitals:   08/06/22 1406  BP: (!) 140/78  Pulse: 81  Temp: 98.3 F (36.8 C)  SpO2: 99%  Weight: 262 lb 3.2 oz (118.9 kg)  Height: '5\' 7"'$  (1.702 m)     Physical Exam Vitals reviewed.  Constitutional:      Appearance: Normal appearance. She is well-developed.  HENT:     Head: Normocephalic and atraumatic.  Eyes:     Conjunctiva/sclera: Conjunctivae normal.     Pupils: Pupils are equal, round, and reactive to light.  Neck:     Vascular: No carotid bruit.  Cardiovascular:     Rate and Rhythm: Normal rate and regular rhythm.     Heart sounds: Normal heart sounds.  Pulmonary:     Effort: Pulmonary effort is normal.     Breath sounds: Normal breath sounds.  Abdominal:     Palpations: Abdomen is soft. There is no pulsatile mass.     Tenderness: There is no abdominal tenderness.  Musculoskeletal:     Right lower leg: No edema.     Left lower leg: No edema.     Comments: Left knee, minimal flexion limitation versus right, but nearly same.  Otherwise intact range of motion, minimal bony tenderness over the lateral joint line, otherwise nontender.  Ambulating without assistance.  Skin:    General: Skin is warm and dry.  Neurological:     Mental Status: She is alert and oriented to person, place, and time.  Psychiatric:        Mood and Affect: Mood normal.        Behavior: Behavior normal.     Assessment & Plan:  Kathy Barron is a 61 y.o. female . Arthritis of left knee Acute pain of left knee - Plan: meloxicam (MOBIC) 7.5 MG tablet  -Tricompartmental degenerative changes.  Some improvement since last visit with meloxicam, will extend course temporarily but potential side effects, risks of that medication were discussed and need for short-term use.  If she requires medication beyond the next week or 2 would recommend orthopedic evaluation.  Essential hypertension -  Plan: amLODipine (NORVASC) 10 MG tablet, lisinopril-hydrochlorothiazide (ZESTORETIC) 20-12.5 MG tablet, Comprehensive metabolic panel  -Borderline in office, lower at home.  No med changes for now.  RTC precautions.  Check labs  Hyperlipidemia, unspecified hyperlipidemia type - Plan: Comprehensive metabolic panel, Lipid panel  No current statin, check labs, then ASCVD risk scoring, medication adjustment accordingly.  Option of coronary calcium scoring if borderline.  Meds ordered this encounter  Medications   amLODipine (NORVASC) 10 MG tablet  Sig: TAKE 1 TABLET(10 MG) BY MOUTH DAILY    Dispense:  90 tablet    Refill:  1   lisinopril-hydrochlorothiazide (ZESTORETIC) 20-12.5 MG tablet    Sig: Take 1 tablet by mouth 2 (two) times daily.    Dispense:  180 tablet    Refill:  1   meloxicam (MOBIC) 7.5 MG tablet    Sig: Take 1-2 tablets (7.5-15 mg total) by mouth daily.    Dispense:  30 tablet    Refill:  0   Patient Instructions  Glad that your knee is better.  Can continue meloxicam for another week or 2 at the most but if it is not significantly better at that time I would recommend seeing orthopedics as we discussed.  Let me know and I will place a referral.  I will check some labs today to help decide on whether you need to be on a cholesterol medication.  No change in blood pressure meds for now.      Signed,   Merri Ray, MD [place], [place] [facility] 08/06/22 2:53 PM

## 2022-08-06 NOTE — Patient Instructions (Signed)
Glad that your knee is better.  Can continue meloxicam for another week or 2 at the most but if it is not significantly better at that time I would recommend seeing orthopedics as we discussed.  Let me know and I will place a referral.  I will check some labs today to help decide on whether you need to be on a cholesterol medication.  No change in blood pressure meds for now.

## 2022-08-07 LAB — COMPREHENSIVE METABOLIC PANEL
ALT: 12 U/L (ref 0–35)
AST: 14 U/L (ref 0–37)
Albumin: 4.2 g/dL (ref 3.5–5.2)
Alkaline Phosphatase: 85 U/L (ref 39–117)
BUN: 10 mg/dL (ref 6–23)
CO2: 30 mEq/L (ref 19–32)
Calcium: 9.3 mg/dL (ref 8.4–10.5)
Chloride: 100 mEq/L (ref 96–112)
Creatinine, Ser: 0.75 mg/dL (ref 0.40–1.20)
GFR: 85.27 mL/min (ref >60.00)
Glucose, Bld: 70 mg/dL (ref 70–99)
Potassium: 3.9 mEq/L (ref 3.5–5.1)
Sodium: 138 mEq/L (ref 135–145)
Total Bilirubin: 0.7 mg/dL (ref 0.2–1.2)
Total Protein: 7.9 g/dL (ref 6.0–8.3)

## 2022-08-07 LAB — LIPID PANEL
Cholesterol: 193 mg/dL (ref 0–200)
HDL: 48.1 mg/dL (ref >39.00)
LDL Cholesterol: 131 mg/dL — ABNORMAL HIGH (ref 0–99)
NonHDL: 144.59
Total CHOL/HDL Ratio: 4
Triglycerides: 67 mg/dL (ref 0.0–149.0)
VLDL: 13.4 mg/dL (ref 0.0–40.0)

## 2022-12-02 ENCOUNTER — Encounter: Payer: Self-pay | Admitting: Family Medicine

## 2023-02-04 ENCOUNTER — Encounter: Payer: BC Managed Care – PPO | Admitting: Family Medicine

## 2023-02-05 ENCOUNTER — Ambulatory Visit (INDEPENDENT_AMBULATORY_CARE_PROVIDER_SITE_OTHER): Payer: BC Managed Care – PPO | Admitting: Family Medicine

## 2023-02-05 ENCOUNTER — Other Ambulatory Visit: Payer: Self-pay

## 2023-02-05 VITALS — BP 138/70 | HR 89 | Temp 98.8°F | Ht 65.0 in | Wt 262.8 lb

## 2023-02-05 DIAGNOSIS — E785 Hyperlipidemia, unspecified: Secondary | ICD-10-CM | POA: Diagnosis not present

## 2023-02-05 DIAGNOSIS — I10 Essential (primary) hypertension: Secondary | ICD-10-CM | POA: Diagnosis not present

## 2023-02-05 DIAGNOSIS — Z1159 Encounter for screening for other viral diseases: Secondary | ICD-10-CM | POA: Diagnosis not present

## 2023-02-05 DIAGNOSIS — Z0000 Encounter for general adult medical examination without abnormal findings: Secondary | ICD-10-CM

## 2023-02-05 DIAGNOSIS — Z114 Encounter for screening for human immunodeficiency virus [HIV]: Secondary | ICD-10-CM

## 2023-02-05 MED ORDER — LISINOPRIL-HYDROCHLOROTHIAZIDE 20-12.5 MG PO TABS: 1.0000 | ORAL_TABLET | Freq: Two times a day (BID) | ORAL | 1 refills | Status: DC

## 2023-02-05 MED ORDER — AMLODIPINE BESYLATE 10 MG PO TABS: ORAL_TABLET | ORAL | 1 refills | Status: DC

## 2023-02-05 NOTE — Progress Notes (Signed)
Subjective:  Patient ID: Kathy Barron, female    DOB: [birth year]  Age: 62 y.o. MRN: [id number]  CC:  Chief Complaint  Patient presents with   Annual Exam    Fasting CPE.    HPI Kathy Barron presents for Annual Exam No recent health changes.  Doing well.  Followed by [facility]. Appt in 05/2022.   Hypertension: Treated with amlodipine 10 mg daily, lisinopril HCTZ 20/12.5 milligrams twice daily. Home readings: 130/70 range. No added salt to food.  Some intermittent ankle swelling, not persistent, resolves overnight. No CP/dyspnea.  BP Readings from Last 3 Encounters:  02/05/23 138/70  08/06/22 (!) 140/78  07/03/22 138/78   Lab Results  Component Value Date   CREATININE 0.75 08/06/2022   Hyperlipidemia: No current meds  No early cardiac disease in family.  Discussed option of CCS depending on ASCVD risk score.  Prefers no new meds for now.  The 10-year ASCVD risk score (Arnett DK, et al., 2019) is: 10%   Values used to calculate the score:     Age: 3 years     Sex: Female     Is Non-Hispanic African American: Yes     Diabetic: No     Tobacco smoker: No     Systolic Blood Pressure: 138 mmHg     Is BP treated: Yes     HDL Cholesterol: 48.1 mg/dL     Total Cholesterol: 193 mg/dL  Lab Results  Component Value Date   CHOL 193 08/06/2022   HDL 48.10 08/06/2022   LDLCALC 131 (H) 08/06/2022   TRIG 67.0 08/06/2022   CHOLHDL 4 08/06/2022   Lab Results  Component Value Date   ALT 12 08/06/2022   AST 14 08/06/2022   ALKPHOS 85 08/06/2022   BILITOT 0.7 08/06/2022         02/05/2023    2:24 PM 08/06/2022    2:04 PM 07/03/2022   10:58 AM 02/03/2022    3:29 PM 06/24/2021    2:54 PM  Depression screen PHQ 2/9  Decreased Interest 0 0 0 0 0  Down, Depressed, Hopeless 0 0 0 0 0  PHQ - 2 Score 0 0 0 0 0  Altered sleeping 0 0 0    Tired, decreased energy 0 0 0    Change in appetite 0 0 0    Feeling bad or failure about yourself  0 0 0    Trouble  concentrating 0 0 0    Moving slowly or fidgety/restless 0 0 0    Suicidal thoughts 0 0 0    PHQ-9 Score 0 0 0    Difficult doing work/chores Not difficult at all        Health Maintenance  Topic Date Due   HIV Screening  Never done   Hepatitis C Screening  Never done   COVID-19 Vaccine (4 - 2023-24 season) 05/23/2022   INFLUENZA VACCINE  04/23/2023   PAP SMEAR-Modifier  12/22/2023   COLONOSCOPY (Pts 45-60yrs Insurance coverage will need to be confirmed)  04/18/2024   MAMMOGRAM  06/11/2024   DTaP/Tdap/Td (2 - Td or Tdap) 07/28/2027   Zoster Vaccines- Shingrix  Completed   HPV VACCINES  Aged Out  Colonoscopy in July 2020, repeat 5 years. No blood in stool or change in bowels.  Mammogram 06/11/22.  Pap in 2022.   Immunization History  Administered Date(s) Administered   Influenza Inj Mdck Quad Pf 06/05/2022   Influenza,inj,Quad PF,6+ Mos 07/20/2013, 07/03/2015, 07/23/2016, 07/27/2017, 07/29/2018, 08/01/2019,  07/26/2020, 06/24/2021   Moderna SARS-COV2 Booster Vaccination 08/13/2020   Moderna Sars-Covid-2 Vaccination 12/01/2019, 01/03/2020   Tdap 07/27/2017   Zoster Recombinat (Shingrix) 02/03/2022, 07/03/2022   Agrees to HIV/hep C testing.   No results found. Wears glasses. Last optho few yrs ago.   Dental: every 3 months.   Alcohol: none  Tobacco: none, no vaping.   Exercise/obesity: Body mass index is 43.73 kg/m. Wt Readings from Last 3 Encounters:  02/05/23 262 lb 12.8 oz (119.2 kg)  08/06/22 262 lb 3.2 oz (118.9 kg)  07/03/22 266 lb 9.6 oz (120.9 kg)  Dance, walking. 3 days per week - 1 hour.  Fast food - 3 times per week.    History Patient Active Problem List   Diagnosis Date Noted   Uterine leiomyoma 07/29/2018   Class 2 obesity due to excess calories without serious comorbidity with body mass index (BMI) of 37.0 to 37.9 in adult 07/27/2017   Essential hypertension 07/23/2016   Past Medical History:  Diagnosis Date   Hypertension    Past Surgical  History:  Procedure Laterality Date   CHOLECYSTECTOMY  2009   TUBAL LIGATION     No Known Allergies Prior to Admission medications   Medication Sig Start Date End Date Taking? Authorizing Provider  Multiple Vitamins-Minerals (MULTIVITAMIN ADULT EXTRA C PO) multivitamin   Yes [provider]  amLODipine (NORVASC) 10 MG tablet TAKE 1 TABLET(10 MG) BY MOUTH DAILY 02/05/23   Shade Flood, MD  lisinopril-hydrochlorothiazide (ZESTORETIC) 20-12.5 MG tablet Take 1 tablet by mouth 2 (two) times daily. 02/05/23   Shade Flood, MD  meloxicam (MOBIC) 7.5 MG tablet Take 1-2 tablets (7.5-15 mg total) by mouth daily. Patient not taking: Reported on 02/05/2023 08/06/22   Shade Flood, MD   Social History   Socioeconomic History   Marital status: Single    Spouse name: Not on file   Number of children: Not on file   Years of education: Not on file   Highest education level: Not on file  Occupational History   Not on file  Tobacco Use   Smoking status: Never   Smokeless tobacco: Never  Vaping Use   Vaping Use: Never used  Substance and Sexual Activity   Alcohol use: No   Drug use: No   Sexual activity: Yes  Other Topics Concern   Not on file  Social History Narrative   Not on file   Social Determinants of Health   Financial Resource Strain: Not on file  Food Insecurity: Not on file  Transportation Needs: Not on file  Physical Activity: Not on file  Stress: Not on file  Social Connections: Not on file  Intimate Partner Violence: Not on file    Review of Systems 13 point review of systems per patient health survey noted.  Negative other than as indicated above or in HPI.    Objective:   Vitals:   02/05/23 1418  BP: 138/70  Pulse: 89  Temp: 98.8 F (37.1 C)  SpO2: 99%  Weight: 262 lb 12.8 oz (119.2 kg)  Height: 5\' 5"  (1.651 m)     Physical Exam Vitals reviewed.  Constitutional:      Appearance: She is well-developed.  HENT:     Head:  Normocephalic and atraumatic.     Right Ear: External ear normal.     Left Ear: External ear normal.  Eyes:     Conjunctiva/sclera: Conjunctivae normal.     Pupils: Pupils are equal, round, and reactive  to light.  Neck:     Thyroid: No thyromegaly.  Cardiovascular:     Rate and Rhythm: Normal rate and regular rhythm.     Heart sounds: Normal heart sounds. No murmur heard. Pulmonary:     Effort: Pulmonary effort is normal. No respiratory distress.     Breath sounds: Normal breath sounds. No wheezing.  Abdominal:     General: Bowel sounds are normal.     Palpations: Abdomen is soft.     Tenderness: There is no abdominal tenderness.  Musculoskeletal:        General: No tenderness. Normal range of motion.     Cervical back: Normal range of motion and neck supple.  Lymphadenopathy:     Cervical: No cervical adenopathy.  Skin:    General: Skin is warm and dry.     Findings: No rash.  Neurological:     Mental Status: She is alert and oriented to person, place, and time.  Psychiatric:        Behavior: Behavior normal.        Thought Content: Thought content normal.        Assessment & Plan:  Kathy Barron is a 62 y.o. female . Annual physical exam  - -anticipatory guidance as below in AVS, screening labs above. Health maintenance items as above in HPI discussed/recommended as applicable.   Essential hypertension  -Stable with current med regimen, continue hydrochlorothiazide lisinopril amlodipine.  Home monitoring with RTC precautions.  Hyperlipidemia, unspecified hyperlipidemia type - Plan: Comprehensive metabolic panel, Lipid panel  -No current meds, ASCVD risk score discussed as well as option of coronary calcium scoring.  Check updated labs and plan adjustment accordingly.  Commended on activity/exercise.  Cut back on fast food.  Need for hepatitis C screening test - Plan: Hepatitis C antibody  Screening for HIV (human immunodeficiency virus) - Plan: HIV Antibody  (routine testing w rflx)   No orders of the defined types were placed in this encounter.  Patient Instructions  RSV vaccine is an option - see below.  No change in meds today. If blood pressure remains over 130/80 let me know.  Call eye specialist for an appointment.  Keep up the good work with exercise, bu try to cut back on fast food.  Thanks for coming in today. Take care.   There is a recommendation for RSV vaccine for patients over age 81.   Typically would recommend the RSV vaccine as most important for patients over age 51 that have comorbidities that put them at increased risk for severe disease (heart disease such as congestive heart failure, coronary artery disease, lung disease such as asthma or COPD, kidney disease, liver disease, diabetes, chronic or progressive neurologic or muscular conditions, immunosuppressed, or being frail or of advanced age).  For others that do not have these risk factors, there still is some benefit from vaccination since age is one of the main risk factors for developing severe disease however baseline risk of developing severe disease and requiring hospitalization is likely to be lower compared to those that have comorbidities in addition to age.   CDC does have some information as well: ToyProtection.fi     Signed,   Meredith Staggers, MD [facility], [facility] 02/05/23 5:19 PM

## 2023-02-05 NOTE — Patient Instructions (Addendum)
RSV vaccine is an option - see below.  No change in meds today. If blood pressure remains over 130/80 let me know.  Call eye specialist for an appointment.  Keep up the good work with exercise, bu try to cut back on fast food.  Thanks for coming in today. Take care.   There is a recommendation for RSV vaccine for patients over age 63.   Typically would recommend the RSV vaccine as most important for patients over age 49 that have comorbidities that put them at increased risk for severe disease (heart disease such as congestive heart failure, coronary artery disease, lung disease such as asthma or COPD, kidney disease, liver disease, diabetes, chronic or progressive neurologic or muscular conditions, immunosuppressed, or being frail or of advanced age).  For others that do not have these risk factors, there still is some benefit from vaccination since age is one of the main risk factors for developing severe disease however baseline risk of developing severe disease and requiring hospitalization is likely to be lower compared to those that have comorbidities in addition to age.   CDC does have some information as well: ToyProtection.fi

## 2023-02-06 LAB — LIPID PANEL
Cholesterol: 206 mg/dL — ABNORMAL HIGH (ref 0–200)
HDL: 52.4 mg/dL (ref >39.00)
LDL Cholesterol: 140 mg/dL — ABNORMAL HIGH (ref 0–99)
NonHDL: 154.04
Total CHOL/HDL Ratio: 4
Triglycerides: 69 mg/dL (ref 0.0–149.0)
VLDL: 13.8 mg/dL (ref 0.0–40.0)

## 2023-02-06 LAB — COMPREHENSIVE METABOLIC PANEL
ALT: 16 U/L (ref 0–35)
AST: 24 U/L (ref 0–37)
Albumin: 4.4 g/dL (ref 3.5–5.2)
Alkaline Phosphatase: 76 U/L (ref 39–117)
BUN: 11 mg/dL (ref 6–23)
CO2: 30 mEq/L (ref 19–32)
Calcium: 9.9 mg/dL (ref 8.4–10.5)
Chloride: 99 mEq/L (ref 96–112)
Creatinine, Ser: 0.74 mg/dL (ref 0.40–1.20)
GFR: 86.35 mL/min (ref >60.00)
Glucose, Bld: 72 mg/dL (ref 70–99)
Potassium: 4.3 mEq/L (ref 3.5–5.1)
Sodium: 136 mEq/L (ref 135–145)
Total Bilirubin: 0.8 mg/dL (ref 0.2–1.2)
Total Protein: 8.1 g/dL (ref 6.0–8.3)

## 2023-02-06 LAB — HEPATITIS C ANTIBODY: Hepatitis C Ab: NONREACTIVE

## 2023-02-06 LAB — HIV ANTIBODY (ROUTINE TESTING W REFLEX): HIV 1&2 Ab, 4th Generation: NONREACTIVE

## 2023-03-27 ENCOUNTER — Telehealth: Payer: Self-pay

## 2023-03-27 NOTE — Telephone Encounter (Signed)
Pt is aware of the lab results  °

## 2023-05-26 ENCOUNTER — Other Ambulatory Visit: Payer: Self-pay | Admitting: Family Medicine

## 2023-05-26 DIAGNOSIS — M25562 Pain in left knee: Secondary | ICD-10-CM

## 2023-07-31 LAB — HM MAMMOGRAPHY

## 2023-08-03 ENCOUNTER — Telehealth: Payer: Self-pay | Admitting: Family Medicine

## 2023-08-03 ENCOUNTER — Other Ambulatory Visit: Payer: Self-pay

## 2023-08-03 DIAGNOSIS — I10 Essential (primary) hypertension: Secondary | ICD-10-CM

## 2023-08-03 MED ORDER — AMLODIPINE BESYLATE 10 MG PO TABS
ORAL_TABLET | ORAL | 1 refills | Status: DC
Start: 2023-08-03 — End: 2024-02-25

## 2023-08-03 MED ORDER — LISINOPRIL-HYDROCHLOROTHIAZIDE 20-12.5 MG PO TABS
1.0000 | ORAL_TABLET | Freq: Two times a day (BID) | ORAL | 1 refills | Status: DC
Start: 2023-08-03 — End: 2024-02-25

## 2023-08-03 NOTE — Telephone Encounter (Signed)
Sent, pt made aware

## 2023-08-03 NOTE — Telephone Encounter (Signed)
Encourage patient to contact the pharmacy for refills or they can request refills through [facility]   WHAT PHARMACY WOULD THEY LIKE THIS SENT TO:  [id number] - [place],  - [address] AT [address]    MEDICATION NAME & DOSE: lisinopril-hydrochlorothiazide (ZESTORETIC) 20-12.5 MG tablet amLODipine (NORVASC) 10 MG tablet   NOTES/COMMENTS FROM PATIENT: Pt's appt is 08/26/2023 2pm      Front office please notify patient: It takes 48-72 hours to process rx refill requests Ask patient to call pharmacy to ensure rx is ready before heading there.

## 2023-08-07 ENCOUNTER — Ambulatory Visit: Payer: BC Managed Care – PPO | Admitting: Family Medicine

## 2023-08-26 ENCOUNTER — Encounter: Payer: Self-pay | Admitting: Family Medicine

## 2023-08-26 ENCOUNTER — Ambulatory Visit: Payer: BC Managed Care – PPO | Admitting: Family Medicine

## 2023-08-26 VITALS — BP 126/78 | HR 78 | Temp 98.2°F | Ht 65.0 in | Wt 264.4 lb

## 2023-08-26 DIAGNOSIS — Z6841 Body Mass Index (BMI) 40.0 and over, adult: Secondary | ICD-10-CM | POA: Diagnosis not present

## 2023-08-26 DIAGNOSIS — I10 Essential (primary) hypertension: Secondary | ICD-10-CM

## 2023-08-26 DIAGNOSIS — E785 Hyperlipidemia, unspecified: Secondary | ICD-10-CM | POA: Diagnosis not present

## 2023-08-26 NOTE — Patient Instructions (Addendum)
Blood pressure looks good.  No medication changes at this time.  I will check cholesterol levels again today and depending on his results we could consider a low-dose medication a few days per week of something like Lipitor.  I will let you know once I review your results.  Continue to watch diet, exercise.  Watch carbs and try the quarter plate for starch and proteins, half plate for vegetables and smaller plate approach if needed to help with portion sizes.  Goal exercise of 150 minutes/week.  Please let me know if there are questions and take care!

## 2023-08-26 NOTE — Progress Notes (Signed)
Subjective:  Patient ID: Kathy Barron, female    DOB: [birth year]  Age: 62 y.o. MRN: [id number]  CC:  Chief Complaint  Patient presents with   Medical Management of Chronic Issues    Pt is doing well, no concerns,     HPI Kathy Barron presents for   Hypertension: Amlodipine 10 mg daily, lisinopril HCTZ 20/12.5 mg daily. No new med side effects.  Home readings:120/70 range.  BP Readings from Last 3 Encounters:  08/26/23 126/78  02/05/23 138/70  08/06/22 (!) 140/78   Lab Results  Component Value Date   CREATININE 0.74 02/05/2023   Wt Readings from Last 3 Encounters:  08/26/23 264 lb 6.4 oz (119.9 kg)  02/05/23 262 lb 12.8 oz (119.2 kg)  08/06/22 262 lb 3.2 oz (118.9 kg)   Hyperlipidemia: No meds at her May visit, no early cardiac disease in the family.  Borderline ASCVD risk score of 10% at that time.  Repeat based on labs in May, now 8.2%.   Option of coronary calcium scoring discussed previously, or option of low-dose statin, intermittent dosing as option. Options discussed again - would consider low dose statin if still elevated.  Has been trying to watch diet and exercise.  Starches may be more of a concern than sweets. Lab Results  Component Value Date   HGBA1C 5.4 06/24/2021   Lab Results  Component Value Date   CHOL 206 (H) 02/05/2023   HDL 52.40 02/05/2023   LDLCALC 140 (H) 02/05/2023   TRIG 69.0 02/05/2023   CHOLHDL 4 02/05/2023   Lab Results  Component Value Date   ALT 16 02/05/2023   AST 24 02/05/2023   ALKPHOS 76 02/05/2023   BILITOT 0.8 02/05/2023       History Patient Active Problem List   Diagnosis Date Noted   Uterine leiomyoma 07/29/2018   Class 2 obesity due to excess calories without serious comorbidity with body mass index (BMI) of 37.0 to 37.9 in adult 07/27/2017   Essential hypertension 07/23/2016   Past Medical History:  Diagnosis Date   Hypertension    Past Surgical History:  Procedure Laterality Date   CHOLECYSTECTOMY   2009   TUBAL LIGATION     No Known Allergies Prior to Admission medications   Medication Sig Start Date End Date Taking? Authorizing Provider  amLODipine (NORVASC) 10 MG tablet TAKE 1 TABLET(10 MG) BY MOUTH DAILY 08/03/23  Yes Shade Flood, MD  lisinopril-hydrochlorothiazide (ZESTORETIC) 20-12.5 MG tablet Take 1 tablet by mouth 2 (two) times daily. 08/03/23  Yes Shade Flood, MD  Multiple Vitamins-Minerals (MULTIVITAMIN ADULT EXTRA C PO) multivitamin   Yes [provider]   Social History   Socioeconomic History   Marital status: Single    Spouse name: Not on file   Number of children: Not on file   Years of education: Not on file   Highest education level: Not on file  Occupational History   Not on file  Tobacco Use   Smoking status: Never   Smokeless tobacco: Never  Vaping Use   Vaping status: Never Used  Substance and Sexual Activity   Alcohol use: No   Drug use: No   Sexual activity: Yes  Other Topics Concern   Not on file  Social History Narrative   Not on file   Social Determinants of Health   Financial Resource Strain: Not on file  Food Insecurity: Not on file  Transportation Needs: Not on file  Physical Activity: Not on file  Stress: Not on file  Social Connections: Not on file  Intimate Partner Violence: Not on file    Review of Systems  Constitutional:  Negative for fatigue and unexpected weight change.  Respiratory:  Negative for chest tightness and shortness of breath.   Cardiovascular:  Negative for chest pain, palpitations and leg swelling.  Gastrointestinal:  Negative for abdominal pain and blood in stool.  Neurological:  Negative for dizziness, syncope, light-headedness and headaches.     Objective:   Vitals:   08/26/23 1358  BP: 126/78  Pulse: 78  Temp: 98.2 F (36.8 C)  TempSrc: Temporal  SpO2: 98%  Weight: 264 lb 6.4 oz (119.9 kg)  Height: 5\' 5"  (1.651 m)  Body mass index is 44 kg/m.    Physical Exam Vitals  reviewed.  Constitutional:      Appearance: Normal appearance. She is well-developed.  HENT:     Head: Normocephalic and atraumatic.  Eyes:     Conjunctiva/sclera: Conjunctivae normal.     Pupils: Pupils are equal, round, and reactive to light.  Neck:     Vascular: No carotid bruit.  Cardiovascular:     Rate and Rhythm: Normal rate and regular rhythm.     Heart sounds: Normal heart sounds.  Pulmonary:     Effort: Pulmonary effort is normal.     Breath sounds: Normal breath sounds.  Abdominal:     Palpations: Abdomen is soft. There is no pulsatile mass.     Tenderness: There is no abdominal tenderness.  Musculoskeletal:     Right lower leg: No edema.     Left lower leg: No edema.  Skin:    General: Skin is warm and dry.  Neurological:     Mental Status: She is alert and oriented to person, place, and time.  Psychiatric:        Mood and Affect: Mood normal.        Behavior: Behavior normal.        Assessment & Plan:  Kathy Barron is a 62 y.o. female . Hyperlipidemia, unspecified hyperlipidemia type - Plan: Comprehensive metabolic panel, Lipid panel, with obesity, BMI 44.  -Borderline ASCVD risk score previously.  Options discussed including trial of low-dose statin depending on her lipids today versus coronary calcium scoring.  She would prefer to try intermittent order low-dose statin depending on labs.  Potential side effects and risk discussed.  Will check labs first before ordering medication.  Continue work with diet, exercise discussed and we reviewed techniques for portion control and minimizing starches, carbohydrates.  Essential hypertension - Plan: Comprehensive metabolic panel  -Stable on current regimen, continue same, recently refilled meds.  Okay to refill prior to next visit if needed in 6 months.  No orders of the defined types were placed in this encounter.  Patient Instructions  Blood pressure looks good.  No medication changes at this time.  I will  check cholesterol levels again today and depending on his results we could consider a low-dose medication a few days per week of something like Lipitor.  I will let you know once I review your results.  Continue to watch diet, exercise.  Watch carbs and try the quarter plate for starch and proteins, half plate for vegetables and smaller plate approach if needed to help with portion sizes.  Goal exercise of 150 minutes/week.  Please let me know if there are questions and take care!    Signed,   Meredith Staggers, MD [facility], [facility] 08/26/23 2:22 PM

## 2023-08-27 LAB — COMPREHENSIVE METABOLIC PANEL
ALT: 15 U/L (ref 0–35)
AST: 19 U/L (ref 0–37)
Albumin: 4.3 g/dL (ref 3.5–5.2)
Alkaline Phosphatase: 80 U/L (ref 39–117)
BUN: 11 mg/dL (ref 6–23)
CO2: 30 meq/L (ref 19–32)
Calcium: 9.8 mg/dL (ref 8.4–10.5)
Chloride: 101 meq/L (ref 96–112)
Creatinine, Ser: 0.68 mg/dL (ref 0.40–1.20)
GFR: 92.59 mL/min (ref >60.00)
Glucose, Bld: 86 mg/dL (ref 70–99)
Potassium: 4 meq/L (ref 3.5–5.1)
Sodium: 137 meq/L (ref 135–145)
Total Bilirubin: 1 mg/dL (ref 0.2–1.2)
Total Protein: 8 g/dL (ref 6.0–8.3)

## 2023-08-27 LAB — LIPID PANEL
Cholesterol: 209 mg/dL — ABNORMAL HIGH (ref 0–200)
HDL: 48.7 mg/dL (ref >39.00)
LDL Cholesterol: 144 mg/dL — ABNORMAL HIGH (ref 0–99)
NonHDL: 160.29
Total CHOL/HDL Ratio: 4
Triglycerides: 79 mg/dL (ref 0.0–149.0)
VLDL: 15.8 mg/dL (ref 0.0–40.0)

## 2023-09-04 ENCOUNTER — Encounter: Payer: Self-pay | Admitting: Family Medicine

## 2024-02-25 ENCOUNTER — Encounter: Payer: Self-pay | Admitting: Family Medicine

## 2024-02-25 ENCOUNTER — Ambulatory Visit: Payer: BC Managed Care – PPO | Admitting: Family Medicine

## 2024-02-25 VITALS — BP 128/82 | HR 86 | Temp 98.2°F | Wt 265.0 lb

## 2024-02-25 DIAGNOSIS — E785 Hyperlipidemia, unspecified: Secondary | ICD-10-CM

## 2024-02-25 DIAGNOSIS — Z23 Encounter for immunization: Secondary | ICD-10-CM | POA: Diagnosis not present

## 2024-02-25 DIAGNOSIS — Z131 Encounter for screening for diabetes mellitus: Secondary | ICD-10-CM | POA: Diagnosis not present

## 2024-02-25 DIAGNOSIS — I10 Essential (primary) hypertension: Secondary | ICD-10-CM

## 2024-02-25 DIAGNOSIS — Z0000 Encounter for general adult medical examination without abnormal findings: Secondary | ICD-10-CM | POA: Diagnosis not present

## 2024-02-25 MED ORDER — LISINOPRIL-HYDROCHLOROTHIAZIDE 20-12.5 MG PO TABS: 1.0000 | ORAL_TABLET | Freq: Two times a day (BID) | ORAL | 2 refills | Status: DC

## 2024-02-25 MED ORDER — AMLODIPINE BESYLATE 10 MG PO TABS: ORAL_TABLET | ORAL | 2 refills | Status: DC

## 2024-02-25 NOTE — Progress Notes (Signed)
 Subjective:  Patient ID: Kathy Barron, female    DOB: March 30, 1961  Age: 63 y.o. MRN: [id number]  CC:  Chief Complaint  Patient presents with   Acute Visit    Patient in room #9 and alone. Patient states she here to follow with her Blood pressure and medication.    HPI Kathy Barron presents for Annual Exam No health changes. Doing well   PCP,[facility] - Dr. Emma Hardy, appt in November 2024.    Hypertension: Amlodipine  10 mg daily, lisinopril  HCTZ 20/12.5 mg daily.  No new medication side effects. Home readings: 120-130/70-80.  BP Readings from Last 3 Encounters:  02/25/24 128/82  08/26/23 126/78  02/05/23 138/70   Lab Results  Component Value Date   CREATININE 0.68 08/26/2023   Hyperlipidemia: Borderline ASCVD risk of 8 to 10%.  No early cardiac disease in the family.  Diet/exercise approach plan, option of coronary calcium scoring has been discussed previously as an option as well.  No current meds. More active during the summer - more opportunities at work. Line dancing 4 days per week - started last week.   Wt Readings from Last 3 Encounters:  02/25/24 265 lb (120.2 kg)  08/26/23 264 lb 6.4 oz (119.9 kg)  02/05/23 262 lb 12.8 oz (119.2 kg)   Lab Results  Component Value Date   CHOL 209 (H) 08/26/2023   HDL 48.70 08/26/2023   LDLCALC 144 (H) 08/26/2023   TRIG 79.0 08/26/2023   CHOLHDL 4 08/26/2023   Lab Results  Component Value Date   ALT 15 08/26/2023   AST 19 08/26/2023   ALKPHOS 80 08/26/2023   BILITOT 1.0 08/26/2023        02/25/2024    2:15 PM 08/26/2023    1:57 PM 02/05/2023    2:24 PM 08/06/2022    2:04 PM 07/03/2022   10:58 AM  Depression screen PHQ 2/9  Decreased Interest 0 0 0 0 0  Down, Depressed, Hopeless 0 0 0 0 0  PHQ - 2 Score 0 0 0 0 0  Altered sleeping 0 0 0 0 0  Tired, decreased energy 0 0 0 0 0  Change in appetite 0 0 0 0 0  Feeling bad or failure about yourself  0 0 0 0 0  Trouble concentrating 0 0 0 0 0  Moving slowly or  fidgety/restless 0 0 0 0 0  Suicidal thoughts 0 0 0 0 0  PHQ-9 Score 0 0 0 0 0  Difficult doing work/chores Not difficult at all  Not difficult at all      Health Maintenance  Topic Date Due   COVID-19 Vaccine (4 - 2024-25 season) 05/24/2023   Cervical Cancer Screening (HPV/Pap Cotest)  12/22/2023   Colonoscopy  04/18/2024   INFLUENZA VACCINE  04/22/2024   MAMMOGRAM  06/11/2024   DTaP/Tdap/Td (2 - Td or Tdap) 07/28/2027   Hepatitis C Screening  Completed   HIV Screening  Completed   Zoster Vaccines- Shingrix   Completed   HPV VACCINES  Aged Out   Meningococcal B Vaccine  Aged Out  Pap test at GYN in November. On progesterone for some spotting. Bx was ok.  Mammogram at gyn in November.  Colonoscopy July 2020 - repeat 5 yrs.has not received call yet.    Immunization History  Administered Date(s) Administered   Influenza Inj Mdck Quad Pf 06/05/2022   Influenza,inj,Quad PF,6+ Mos 07/20/2013, 07/03/2015, 07/23/2016, 07/27/2017, 07/29/2018, 08/01/2019, 07/26/2020, 06/24/2021   Influenza-Unspecified 06/25/2023   Moderna SARS-COV2 Booster Vaccination  08/13/2020   Moderna Sars-Covid-2 Vaccination 12/01/2019, 01/03/2020   Tdap 07/27/2017   Zoster Recombinant(Shingrix ) 02/03/2022, 07/03/2022  Prevnar 20 today.  Covid booster and flu vaccine recommended in fall.   No results found. Wears glasses.  Optho visit last December. Min change in Rx.   Dental: every 3 months.   Alcohol: none  Tobacco: none  Exercise: chair fitness on Monday, line dancing as above.    Some fast food, but getting better. 3 times per week - avoiding sugar beverages usually.    History Patient Active Problem List   Diagnosis Date Noted   Uterine leiomyoma 07/29/2018   Class 2 obesity due to excess calories without serious comorbidity with body mass index (BMI) of 37.0 to 37.9 in adult 07/27/2017   Essential hypertension 07/23/2016   Past Medical History:  Diagnosis Date   Hypertension    Past  Surgical History:  Procedure Laterality Date   CHOLECYSTECTOMY  2009   TUBAL LIGATION     No Known Allergies Prior to Admission medications   Medication Sig Start Date End Date Taking? Authorizing Provider  amLODipine  (NORVASC ) 10 MG tablet TAKE 1 TABLET(10 MG) BY MOUTH DAILY 08/03/23  Yes Benjiman Bras, MD  lisinopril -hydrochlorothiazide  (ZESTORETIC ) 20-12.5 MG tablet Take 1 tablet by mouth 2 (two) times daily. 08/03/23  Yes Benjiman Bras, MD  medroxyPROGESTERone (PROVERA) 10 MG tablet Take 10 mg by mouth at bedtime. 12/31/23  Yes [provider]  Multiple Vitamins-Minerals (MULTIVITAMIN ADULT EXTRA C PO) multivitamin   Yes [provider]   Social History   Socioeconomic History   Marital status: Single    Spouse name: Not on file   Number of children: Not on file   Years of education: Not on file   Highest education level: Not on file  Occupational History   Not on file  Tobacco Use   Smoking status: Never   Smokeless tobacco: Never  Vaping Use   Vaping status: Never Used  Substance and Sexual Activity   Alcohol use: No   Drug use: No   Sexual activity: Yes  Other Topics Concern   Not on file  Social History Narrative   Not on file   Social Drivers of Health   Financial Resource Strain: Not on file  Food Insecurity: Not on file  Transportation Needs: Not on file  Physical Activity: Not on file  Stress: Not on file  Social Connections: Not on file  Intimate Partner Violence: Not on file    Review of Systems 13 point review of systems per patient health survey noted.  Negative other than as indicated above or in HPI.    Objective:   Vitals:   02/25/24 1409  BP: 128/82  Pulse: 86  Temp: 98.2 F (36.8 C)  SpO2: 99%  Weight: 265 lb (120.2 kg)     Physical Exam Vitals reviewed.  Constitutional:      Appearance: She is well-developed.  HENT:     Head: Normocephalic and atraumatic.     Right Ear: External ear normal.     Left  Ear: External ear normal.  Eyes:     Conjunctiva/sclera: Conjunctivae normal.     Pupils: Pupils are equal, round, and reactive to light.  Neck:     Thyroid : No thyromegaly.  Cardiovascular:     Rate and Rhythm: Normal rate and regular rhythm.     Heart sounds: Normal heart sounds. No murmur heard. Pulmonary:     Effort: Pulmonary effort is normal.  No respiratory distress.     Breath sounds: Normal breath sounds. No wheezing.  Abdominal:     General: Bowel sounds are normal.     Palpations: Abdomen is soft.     Tenderness: There is no abdominal tenderness.  Musculoskeletal:        General: No tenderness. Normal range of motion.     Cervical back: Normal range of motion and neck supple.  Lymphadenopathy:     Cervical: No cervical adenopathy.  Skin:    General: Skin is warm and dry.     Findings: No rash.  Neurological:     Mental Status: She is alert and oriented to person, place, and time.  Psychiatric:        Behavior: Behavior normal.        Thought Content: Thought content normal.        Assessment & Plan:  Kathy Barron is a 63 y.o. female . Annual physical exam - Plan: CBC, Comprehensive metabolic panel with GFR, Lipid panel, Hemoglobin A1c  - -anticipatory guidance as below in AVS, screening labs above. Health maintenance items as above in HPI discussed/recommended as applicable.   Hyperlipidemia, unspecified hyperlipidemia type - Plan: Comprehensive metabolic panel with GFR, Lipid panel  - Commended on exercise.  Check labs, review ASCVD risk score, consider coronary calcium scoring, no new meds for now.  Essential hypertension - Plan: CBC, amLODipine  (NORVASC ) 10 MG tablet, lisinopril -hydrochlorothiazide  (ZESTORETIC ) 20-12.5 MG tablet  -  Stable, tolerating current regimen. Medications refilled. Labs pending as above.   Screening for diabetes mellitus - Plan: Hemoglobin A1c  Need for pneumococcal vaccination - Plan: Pneumococcal conjugate vaccine 20-valent  (Prevnar 20)  - given.   Meds ordered this encounter  Medications   amLODipine  (NORVASC ) 10 MG tablet    Sig: TAKE 1 TABLET(10 MG) BY MOUTH DAILY    Dispense:  90 tablet    Refill:  2   lisinopril -hydrochlorothiazide  (ZESTORETIC ) 20-12.5 MG tablet    Sig: Take 1 tablet by mouth 2 (two) times daily.    Dispense:  180 tablet    Refill:  2   Patient Instructions  Thank you for coming in today.  See information below but I think you are doing well.  No change in meds at this time.  If any concerns on labs I will let you know.  Keep up the good work with exercise, I think that will help with cholesterol, weight, blood pressure and multiple components of your health.  I will see you in 6 months but please let me know if there are any questions in the meantime.  Preventive Care 49-21 Years Old, Female Preventive care refers to lifestyle choices and visits with your health care provider that can promote health and wellness. Preventive care visits are also called wellness exams. What can I expect for my preventive care visit? Counseling Your health care provider may ask you questions about your: Medical history, including: Past medical problems. Family medical history. Pregnancy history. Current health, including: Menstrual cycle. Method of birth control. Emotional well-being. Home life and relationship well-being. Sexual activity and sexual health. Lifestyle, including: Alcohol, nicotine or tobacco, and drug use. Access to firearms. Diet, exercise, and sleep habits. Work and work Astronomer. Sunscreen use. Safety issues such as seatbelt and bike helmet use. Physical exam Your health care provider will check your: Height and weight. These may be used to calculate your BMI (body mass index). BMI is a measurement that tells if you are at a healthy  weight. Waist circumference. This measures the distance around your waistline. This measurement also tells if you are at a healthy weight  and may help predict your risk of certain diseases, such as type 2 diabetes and high blood pressure. Heart rate and blood pressure. Body temperature. Skin for abnormal spots. What immunizations do I need?  Vaccines are usually given at various ages, according to a schedule. Your health care provider will recommend vaccines for you based on your age, medical history, and lifestyle or other factors, such as travel or where you work. What tests do I need? Screening Your health care provider may recommend screening tests for certain conditions. This may include: Lipid and cholesterol levels. Diabetes screening. This is done by checking your blood sugar (glucose) after you have not eaten for a while (fasting). Pelvic exam and Pap test. Hepatitis B test. Hepatitis C test. HIV (human immunodeficiency virus) test. STI (sexually transmitted infection) testing, if you are at risk. Lung cancer screening. Colorectal cancer screening. Mammogram. Talk with your health care provider about when you should start having regular mammograms. This may depend on whether you have a family history of breast cancer. BRCA-related cancer screening. This may be done if you have a family history of breast, ovarian, tubal, or peritoneal cancers. Bone density scan. This is done to screen for osteoporosis. Talk with your health care provider about your test results, treatment options, and if necessary, the need for more tests. Follow these instructions at home: Eating and drinking  Eat a diet that includes fresh fruits and vegetables, whole grains, lean protein, and low-fat dairy products. Take vitamin and mineral supplements as recommended by your health care provider. Do not drink alcohol if: Your health care provider tells you not to drink. You are pregnant, may be pregnant, or are planning to become pregnant. If you drink alcohol: Limit how much you have to 0-1 drink a day. Know how much alcohol is in your  drink. In the [place] one drink equals one 12 oz bottle of beer (355 mL), one 5 oz glass of wine (148 mL), or one 1 oz glass of hard liquor (44 mL). Lifestyle Brush your teeth every morning and night with fluoride toothpaste. Floss one time each day. Exercise for at least 30 minutes 5 or more days each week. Do not use any products that contain nicotine or tobacco. These products include cigarettes, chewing tobacco, and vaping devices, such as e-cigarettes. If you need help quitting, ask your health care provider. Do not use drugs. If you are sexually active, practice safe sex. Use a condom or other form of protection to prevent STIs. If you do not wish to become pregnant, use a form of birth control. If you plan to become pregnant, see your health care provider for a prepregnancy visit. Take aspirin only as told by your health care provider. Make sure that you understand how much to take and what form to take. Work with your health care provider to find out whether it is safe and beneficial for you to take aspirin daily. Find healthy ways to manage stress, such as: Meditation, yoga, or listening to music. Journaling. Talking to a trusted person. Spending time with friends and family. Minimize exposure to UV radiation to reduce your risk of skin cancer. Safety Always wear your seat belt while driving or riding in a vehicle. Do not drive: If you have been drinking alcohol. Do not ride with someone who has been drinking. When you are tired or distracted.  While texting. If you have been using any mind-altering substances or drugs. Wear a helmet and other protective equipment during sports activities. If you have firearms in your house, make sure you follow all gun safety procedures. Seek help if you have been physically or sexually abused. What's next? Visit your health care provider once a year for an annual wellness visit. Ask your health care provider how often you should have your eyes and  teeth checked. Stay up to date on all vaccines. This information is not intended to replace advice given to you by your health care provider. Make sure you discuss any questions you have with your health care provider. Document Revised: 03/06/2021 Document Reviewed: 03/06/2021 Elsevier Patient Education  2024 [employer]     Signed,   Caro Christmas, MD [facility], [facility] 02/25/24 3:03 PM

## 2024-02-25 NOTE — Patient Instructions (Signed)
 Thank you for coming in today.  See information below but I think you are doing well.  No change in meds at this time.  If any concerns on labs I will let you know.  Keep up the good work with exercise, I think that will help with cholesterol, weight, blood pressure and multiple components of your health.  I will see you in 6 months but please let me know if there are any questions in the meantime.  Preventive Care 57-64 Years Old, Female Preventive care refers to lifestyle choices and visits with your health care provider that can promote health and wellness. Preventive care visits are also called wellness exams. What can I expect for my preventive care visit? Counseling Your health care provider may ask you questions about your: Medical history, including: Past medical problems. Family medical history. Pregnancy history. Current health, including: Menstrual cycle. Method of birth control. Emotional well-being. Home life and relationship well-being. Sexual activity and sexual health. Lifestyle, including: Alcohol, nicotine or tobacco, and drug use. Access to firearms. Diet, exercise, and sleep habits. Work and work Astronomer. Sunscreen use. Safety issues such as seatbelt and bike helmet use. Physical exam Your health care provider will check your: Height and weight. These may be used to calculate your BMI (body mass index). BMI is a measurement that tells if you are at a healthy weight. Waist circumference. This measures the distance around your waistline. This measurement also tells if you are at a healthy weight and may help predict your risk of certain diseases, such as type 2 diabetes and high blood pressure. Heart rate and blood pressure. Body temperature. Skin for abnormal spots. What immunizations do I need?  Vaccines are usually given at various ages, according to a schedule. Your health care provider will recommend vaccines for you based on your age, medical history, and  lifestyle or other factors, such as travel or where you work. What tests do I need? Screening Your health care provider may recommend screening tests for certain conditions. This may include: Lipid and cholesterol levels. Diabetes screening. This is done by checking your blood sugar (glucose) after you have not eaten for a while (fasting). Pelvic exam and Pap test. Hepatitis B test. Hepatitis C test. HIV (human immunodeficiency virus) test. STI (sexually transmitted infection) testing, if you are at risk. Lung cancer screening. Colorectal cancer screening. Mammogram. Talk with your health care provider about when you should start having regular mammograms. This may depend on whether you have a family history of breast cancer. BRCA-related cancer screening. This may be done if you have a family history of breast, ovarian, tubal, or peritoneal cancers. Bone density scan. This is done to screen for osteoporosis. Talk with your health care provider about your test results, treatment options, and if necessary, the need for more tests. Follow these instructions at home: Eating and drinking  Eat a diet that includes fresh fruits and vegetables, whole grains, lean protein, and low-fat dairy products. Take vitamin and mineral supplements as recommended by your health care provider. Do not drink alcohol if: Your health care provider tells you not to drink. You are pregnant, may be pregnant, or are planning to become pregnant. If you drink alcohol: Limit how much you have to 0-1 drink a day. Know how much alcohol is in your drink. In the [place] one drink equals one 12 oz bottle of beer (355 mL), one 5 oz glass of wine (148 mL), or one 1 oz glass of hard liquor (44  mL). Lifestyle Brush your teeth every morning and night with fluoride toothpaste. Floss one time each day. Exercise for at least 30 minutes 5 or more days each week. Do not use any products that contain nicotine or tobacco. These  products include cigarettes, chewing tobacco, and vaping devices, such as e-cigarettes. If you need help quitting, ask your health care provider. Do not use drugs. If you are sexually active, practice safe sex. Use a condom or other form of protection to prevent STIs. If you do not wish to become pregnant, use a form of birth control. If you plan to become pregnant, see your health care provider for a prepregnancy visit. Take aspirin only as told by your health care provider. Make sure that you understand how much to take and what form to take. Work with your health care provider to find out whether it is safe and beneficial for you to take aspirin daily. Find healthy ways to manage stress, such as: Meditation, yoga, or listening to music. Journaling. Talking to a trusted person. Spending time with friends and family. Minimize exposure to UV radiation to reduce your risk of skin cancer. Safety Always wear your seat belt while driving or riding in a vehicle. Do not drive: If you have been drinking alcohol. Do not ride with someone who has been drinking. When you are tired or distracted. While texting. If you have been using any mind-altering substances or drugs. Wear a helmet and other protective equipment during sports activities. If you have firearms in your house, make sure you follow all gun safety procedures. Seek help if you have been physically or sexually abused. What's next? Visit your health care provider once a year for an annual wellness visit. Ask your health care provider how often you should have your eyes and teeth checked. Stay up to date on all vaccines. This information is not intended to replace advice given to you by your health care provider. Make sure you discuss any questions you have with your health care provider. Document Revised: 03/06/2021 Document Reviewed: 03/06/2021 Elsevier Patient Education  2024 [employer].

## 2024-02-26 LAB — COMPREHENSIVE METABOLIC PANEL WITH GFR
ALT: 13 U/L (ref 0–35)
AST: 17 U/L (ref 0–37)
Albumin: 4.5 g/dL (ref 3.5–5.2)
Alkaline Phosphatase: 58 U/L (ref 39–117)
BUN: 10 mg/dL (ref 6–23)
CO2: 28 meq/L (ref 19–32)
Calcium: 9.6 mg/dL (ref 8.4–10.5)
Chloride: 102 meq/L (ref 96–112)
Creatinine, Ser: 0.86 mg/dL (ref 0.40–1.20)
GFR: 71.57 mL/min (ref >60.00)
Glucose, Bld: 87 mg/dL (ref 70–99)
Potassium: 4.1 meq/L (ref 3.5–5.1)
Sodium: 138 meq/L (ref 135–145)
Total Bilirubin: 0.9 mg/dL (ref 0.2–1.2)
Total Protein: 8 g/dL (ref 6.0–8.3)

## 2024-02-26 LAB — CBC
HCT: 36 % (ref 36.0–46.0)
Hemoglobin: 11.5 g/dL — ABNORMAL LOW (ref 12.0–15.0)
MCHC: 31.8 g/dL (ref 30.0–36.0)
MCV: 75.8 fl — ABNORMAL LOW (ref 78.0–100.0)
Platelets: 373 10*3/uL (ref 150.0–400.0)
RBC: 4.76 Mil/uL (ref 3.87–5.11)
RDW: 14.8 % (ref 11.5–15.5)
WBC: 9.8 10*3/uL (ref 4.0–10.5)

## 2024-02-26 LAB — LIPID PANEL
Cholesterol: 191 mg/dL (ref 0–200)
HDL: 47.9 mg/dL (ref >39.00)
LDL Cholesterol: 134 mg/dL — ABNORMAL HIGH (ref 0–99)
NonHDL: 142.92
Total CHOL/HDL Ratio: 4
Triglycerides: 46 mg/dL (ref 0.0–149.0)
VLDL: 9.2 mg/dL (ref 0.0–40.0)

## 2024-02-26 LAB — HEMOGLOBIN A1C: Hgb A1c MFr Bld: 5.4 % (ref 4.6–6.5)

## 2024-03-01 ENCOUNTER — Ambulatory Visit: Payer: Self-pay | Admitting: Family Medicine

## 2024-04-21 ENCOUNTER — Telehealth: Payer: Self-pay

## 2024-04-21 NOTE — Telephone Encounter (Signed)
 Request sent for PAP and Mammo to [facility]

## 2024-04-23 ENCOUNTER — Encounter: Payer: Self-pay | Admitting: Gastroenterology

## 2024-08-22 ENCOUNTER — Other Ambulatory Visit: Payer: Self-pay | Admitting: Family Medicine

## 2024-08-22 DIAGNOSIS — I10 Essential (primary) hypertension: Secondary | ICD-10-CM

## 2024-08-24 ENCOUNTER — Ambulatory Visit: Admitting: Family Medicine

## 2024-09-12 ENCOUNTER — Other Ambulatory Visit: Payer: Self-pay

## 2024-09-12 ENCOUNTER — Ambulatory Visit
Admission: RE | Admit: 2024-09-12 | Discharge: 2024-09-12 | Disposition: A | Discharge status: Home / Self Care | Attending: Emergency Medicine | Admitting: Emergency Medicine

## 2024-09-12 VITALS — BP 160/91 | HR 98 | Temp 98.3°F | Resp 16 | Ht 67.0 in | Wt 250.0 lb

## 2024-09-12 DIAGNOSIS — J101 Influenza due to other identified influenza virus with other respiratory manifestations: Secondary | ICD-10-CM

## 2024-09-12 LAB — POCT INFLUENZA A/B
Influenza A, POC: POSITIVE — AB
Influenza B, POC: NEGATIVE

## 2024-09-12 MED ORDER — PROMETHAZINE-DM 6.25-15 MG/5ML PO SYRP: 5.0000 mL | ORAL_SOLUTION | Freq: Four times a day (QID) | ORAL | 0 refills | Status: AC | PRN

## 2024-09-12 NOTE — ED Triage Notes (Signed)
 Pt presents with c/o productive cough, headaches, and generalized body aches. This is day three of symptoms. Has been taking OTC Mucinex, Nyquil, and Dayquil for symptoms with some improvement/relief. Pt shares that her coworker recently tested positive for the flu. Currently rates overall pain a 3/10. No fevers.

## 2024-09-12 NOTE — ED Provider Notes (Signed)
 " [facility]    CSN: [id number] Arrival date & time: 09/12/24  1612      History   Chief Complaint Chief Complaint  Patient presents with   Cough   Headache    HPI Kathy Barron is a 63 y.o. female.   Patient presents to clinic over concern of productive cough, headache, generalized bodyaches, nasal congestion, rhinorrhea and slightly sore throat for the past 3 days or so.  Has had recent flu exposures in her coworkers.  Endorses mild wheezing and shortness of breath which has improved over the past few days.  Has tried Mucinex, NyQuil and DayQuil for symptoms with some improvement.  Has not had any fevers at home.  Did get her COVID and flu vaccines this season.  The history is provided by the patient and medical records.  Cough Headache   Past Medical History:  Diagnosis Date   Hypertension     Patient Active Problem List   Diagnosis Date Noted   Uterine leiomyoma 07/29/2018   Class 2 obesity due to excess calories without serious comorbidity with body mass index (BMI) of 37.0 to 37.9 in adult 07/27/2017   Essential hypertension 07/23/2016    Past Surgical History:  Procedure Laterality Date   CHOLECYSTECTOMY  2009   TUBAL LIGATION      OB History   No obstetric history on file.      Home Medications    Prior to Admission medications  Medication Sig Start Date End Date Taking? Authorizing Provider  promethazine -dextromethorphan (PROMETHAZINE -DM) 6.25-15 MG/5ML syrup Take 5 mLs by mouth 4 (four) times daily as needed for cough. 09/12/24  Yes Zahira Brummond  N, FNP  amLODipine  (NORVASC ) 10 MG tablet TAKE 1 TABLET(10 MG) BY MOUTH DAILY 08/23/24   Levora Reyes SAUNDERS, MD  lisinopril -hydrochlorothiazide  (ZESTORETIC ) 20-12.5 MG tablet Take 1 tablet by mouth 2 (two) times daily. 02/25/24   Levora Reyes SAUNDERS, MD  medroxyPROGESTERone (PROVERA) 10 MG tablet Take 10 mg by mouth at bedtime. 12/31/23   [provider]  Multiple Vitamins-Minerals  (MULTIVITAMIN ADULT EXTRA C PO) multivitamin    [provider]    Family History Family History  Problem Relation Age of Onset   Hyperlipidemia Mother    Colon cancer Mother 17   Cancer Father    Esophageal cancer Neg Hx    Rectal cancer Neg Hx    Stomach cancer Neg Hx    Colon polyps Neg Hx     Social History Social History[1]   Allergies   Patient has no known allergies.   Review of Systems Review of Systems  Per HPI  Physical Exam Triage Vital Signs ED Triage Vitals  Encounter Vitals Group     BP 09/12/24 1714 (!) 160/91     Girls Systolic BP Percentile --      Girls Diastolic BP Percentile --      Boys Systolic BP Percentile --      Boys Diastolic BP Percentile --      Pulse Rate 09/12/24 1714 98     Resp 09/12/24 1714 16     Temp 09/12/24 1714 98.3 F (36.8 C)     Temp Source 09/12/24 1714 Oral     SpO2 09/12/24 1714 96 %     Weight 09/12/24 1714 250 lb (113.4 kg)     Height 09/12/24 1727 5' 7 (1.702 m)     Head Circumference --      Peak Flow --      Pain  Score 09/12/24 1726 3     Pain Loc --      Pain Education --      Exclude from Growth Chart --    No data found.  Updated Vital Signs BP (!) 160/91 (BP Location: Right Arm) Comment: pt states she hasnt taken her meds today  Pulse 98   Temp 98.3 F (36.8 C) (Oral)   Resp 16   Ht 5' 7 (1.702 m)   Wt 250 lb (113.4 kg)   SpO2 96%   BMI 39.16 kg/m   Visual Acuity Right Eye Distance:   Left Eye Distance:   Bilateral Distance:    Right Eye Near:   Left Eye Near:    Bilateral Near:     Physical Exam Vitals and nursing note reviewed.  Constitutional:      Appearance: Normal appearance.  HENT:     Head: Normocephalic and atraumatic.     Right Ear: External ear normal.     Left Ear: External ear normal.     Nose: Congestion and rhinorrhea present.     Mouth/Throat:     Mouth: Mucous membranes are moist.     Pharynx: Posterior oropharyngeal erythema present.  Eyes:      Conjunctiva/sclera: Conjunctivae normal.  Cardiovascular:     Rate and Rhythm: Normal rate and regular rhythm.     Heart sounds: Normal heart sounds. No murmur heard. Pulmonary:     Effort: Pulmonary effort is normal. No respiratory distress.     Breath sounds: Normal breath sounds. No wheezing.  Skin:    General: Skin is warm and dry.  Neurological:     General: No focal deficit present.     Mental Status: She is alert.  Psychiatric:        Mood and Affect: Mood normal.      UC Treatments / Results  Labs (all labs ordered are listed, but only abnormal results are displayed) Labs Reviewed  POCT INFLUENZA A/B - Abnormal; Notable for the following components:      Result Value   Influenza A, POC Positive (*)    All other components within normal limits    EKG   Radiology No results found.  Procedures Procedures (including critical care time)  Medications Ordered in UC Medications - No data to display  Initial Impression / Assessment and Plan / UC Course  I have reviewed the triage vital signs and the nursing notes.  Pertinent labs & imaging results that were available during my care of the patient were reviewed by me and considered in my medical decision making (see chart for details).  Vitals and triage reviewed, patient is hemodynamically stable.  Lungs vesicular, heart with regular rate and rhythm.  Congestion, rhinorrhea and postnasal drip present.  Symptoms consistent with viral URI, POC testing positive for influenza A.  Outside the window for Tamiflu.  Symptomatic management for viral illness discussed.  Plan of care, follow-up care and return precautions given, no questions at this time.    Final Clinical Impressions(s) / UC Diagnoses   Final diagnoses:  Influenza A     Discharge Instructions      You tested positive for influenza A, the flu.  This is a viral illness that typically last 5 to 7 days.  You can alternate between 600 mg of ibuprofen  and 500 mg of Tylenol every 4-6 hours to help with fever, body aches and chills. Use the cough syrup as needed, this may cause drowsiness.  Over-the-counter cough drops, tea with honey and warm saline gargles can help soothe sore throat and help with cough.    ED Prescriptions     Medication Sig Dispense Auth. Provider   promethazine -dextromethorphan (PROMETHAZINE -DM) 6.25-15 MG/5ML syrup Take 5 mLs by mouth 4 (four) times daily as needed for cough. 118 mL Dreama, Karolynn Infantino  N, FNP      PDMP not reviewed this encounter.    [1]  Social History Tobacco Use   Smoking status: Never   Smokeless tobacco: Never  Vaping Use   Vaping status: Never Used  Substance Use Topics   Alcohol use: No   Drug use: No     Dreama [employer]  N, FNP 09/12/24 1751  "

## 2024-09-12 NOTE — Discharge Instructions (Addendum)
 You tested positive for influenza A, the flu.  This is a viral illness that typically last 5 to 7 days.  You can alternate between 600 mg of ibuprofen and 500 mg of Tylenol every 4-6 hours to help with fever, body aches and chills. Use the cough syrup as needed, this may cause drowsiness.   Over-the-counter cough drops, tea with honey and warm saline gargles can help soothe sore throat and help with cough.

## 2024-10-10 ENCOUNTER — Encounter: Payer: Self-pay | Admitting: Family Medicine

## 2024-10-10 ENCOUNTER — Ambulatory Visit: Admitting: Family Medicine

## 2024-10-10 VITALS — BP 128/74 | HR 89 | Temp 98.3°F | Resp 16 | Ht 67.0 in | Wt 264.0 lb

## 2024-10-10 DIAGNOSIS — I10 Essential (primary) hypertension: Secondary | ICD-10-CM

## 2024-10-10 DIAGNOSIS — E785 Hyperlipidemia, unspecified: Secondary | ICD-10-CM | POA: Diagnosis not present

## 2024-10-10 DIAGNOSIS — M25561 Pain in right knee: Secondary | ICD-10-CM | POA: Diagnosis not present

## 2024-10-10 MED ORDER — AMLODIPINE BESYLATE 10 MG PO TABS: ORAL_TABLET | ORAL | 2 refills | Status: AC

## 2024-10-10 MED ORDER — LISINOPRIL-HYDROCHLOROTHIAZIDE 20-12.5 MG PO TABS: 1.0000 | ORAL_TABLET | Freq: Two times a day (BID) | ORAL | 2 refills | Status: AC

## 2024-10-10 NOTE — Patient Instructions (Addendum)
" °  Try Voltaren gel over-the-counter for right knee pain.  If you require that more than a week or 2, or any significant findings on your x-ray, I think meeting with orthopedics would be the next step.  Let me know.  Please have x-ray at the [facility] location below.  No change in medications at this time.  Please call gastroenterology to schedule colonoscopy.  Continue to watch diet and exercise, will try to help your knee pain to help with that plan.  Thank you for coming in today. [facility] or xray: Walk in 8:30-4:30 during weekdays, no appointment needed [address]  [place], [place] [address]  [facility] [phone] "

## 2024-10-10 NOTE — Progress Notes (Unsigned)
 "  Subjective:  Patient ID: Kathy Barron, female    DOB: [birth year]  Age: 64 y.o. MRN: [id number]  CC:  Chief Complaint  Patient presents with   Follow-up    6 month follow up. No questions or concerns.   Had covid/flu vaccine at work during the holidays Pap was last week [facility] Colonoscopy needs to schedule    HPI Kathy Barron   Hypertension: Amlodipine  10 mg daily, lisinopril  HCTZ 20/12.5 mg daily.  Home readings: 120-130/70 range.  BP Readings from Last 3 Encounters:  10/10/24 128/74  09/12/24 (!) 160/91  02/25/24 128/82   Lab Results  Component Value Date   CREATININE 0.86 02/25/2024   Hyperlipidemia with obesity.  No early cardiac disease in the family, diet/exercise approach planned with borderline ASCVD risk score previously.  We have discussed coronary calcium scoring as an option as well. Had influenza few weeks ago. Cough has improved, minimal residual cough. Mild illness.  Exercising limited by R knee pain.   Wt Readings from Last 3 Encounters:  10/10/24 264 lb (119.7 kg)  09/12/24 250 lb (113.4 kg)  02/25/24 265 lb (120.2 kg)   Lab Results  Component Value Date   HGBA1C 5.4 02/25/2024   The 10-year ASCVD risk score (Arnett DK, et al., 2019) is: 8.6%   Values used to calculate the score:     Age: 89 years     Clinically relevant sex: Female     Is Non-Hispanic African American: Yes     Diabetic: No     Tobacco smoker: No     Systolic Blood Pressure: 128 mmHg     Is BP treated: Yes     HDL Cholesterol: 47.9 mg/dL     Total Cholesterol: 191 mg/dL  Lab Results  Component Value Date   CHOL 191 02/25/2024   HDL 47.90 02/25/2024   LDLCALC 134 (H) 02/25/2024   TRIG 46.0 02/25/2024   CHOLHDL 4 02/25/2024   Lab Results  Component Value Date   ALT 13 02/25/2024   AST 17 02/25/2024   ALKPHOS 58 02/25/2024   BILITOT 0.9 02/25/2024   R knee pain: Off and on Barron past few months.  No swelling. Stiff in morning. Sore up and  down stairs.  Uk:uneprjo treatment - min relief. Not voltaren.  R knee XR in 2020: There is no fracture or dislocation. There appears to be a joint effusion. 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 Small osteophytes in the periphery of the medial compartment.  HM: Colonoscopy 03/2019 - repeat 5 years. Received card. Has not yet scheduled.  GYN - Landy Stains - UTD on pap.   History Patient Active Problem List   Diagnosis Date Noted   Uterine leiomyoma 07/29/2018   Class 2 obesity due to excess calories without serious comorbidity with body mass index (BMI) of 37.0 to 37.9 in adult 07/27/2017   Essential hypertension 07/23/2016   Past Medical History:  Diagnosis Date   Hypertension    Past Surgical History:  Procedure Laterality Date   CHOLECYSTECTOMY  2009   TUBAL LIGATION     Allergies[1] Prior to Admission medications  Medication Sig Start Date End Date Taking? Authorizing Provider  amLODipine  (NORVASC ) 10 MG tablet TAKE 1 TABLET(10 MG) BY MOUTH DAILY 08/23/24  Yes Levora Reyes SAUNDERS, MD  Boswellia-Glucosamine-Vit D (OSTEO BI-FLEX ONE PER DAY PO) Take 1 tablet by mouth daily.   Yes [provider]  lisinopril -hydrochlorothiazide  (ZESTORETIC ) 20-12.5 MG tablet Take  1 tablet by mouth 2 (two) times daily. 02/25/24  Yes Levora Reyes SAUNDERS, MD  medroxyPROGESTERone (PROVERA) 10 MG tablet Take 10 mg by mouth at bedtime. 12/31/23  Yes [provider]  Multiple Vitamins-Minerals (MULTIVITAMIN ADULT EXTRA C PO) multivitamin   Yes [provider]  promethazine -dextromethorphan (PROMETHAZINE -DM) 6.25-15 MG/5ML syrup Take 5 mLs by mouth 4 (four) times daily as needed Barron cough. 09/12/24  Yes Ball, [place]  G, FNP   Social History   Socioeconomic History   Marital status: Single    Spouse name: Not on file   Number of children: Not on file   Years of education: Not on file   Highest education level: Not on file  Occupational History    Not on file  Tobacco Use   Smoking status: Never   Smokeless tobacco: Never  Vaping Use   Vaping status: Never Used  Substance and Sexual Activity   Alcohol use: No   Drug use: No   Sexual activity: Yes  Other Topics Concern   Not on file  Social History Narrative   Not on file   Social Drivers of Health   Tobacco Use: Low Risk (10/10/2024)   Patient History    Smoking Tobacco Use: Never    Smokeless Tobacco Use: Never    Passive Exposure: Not on file  Financial Resource Strain: Not on file  Food Insecurity: Not on file  Transportation Needs: Not on file  Physical Activity: Not on file  Stress: Not on file  Social Connections: Not on file  Intimate Partner Violence: Not on file  Depression (PHQ2-9): Low Risk (10/10/2024)   Depression (PHQ2-9)    PHQ-2 Score: 0  Alcohol Screen: Not on file  Housing: Not on file  Utilities: Not on file  Health Literacy: Not on file    Review of Systems  Constitutional:  Negative Barron fatigue and unexpected weight change.  Respiratory:  Negative Barron chest tightness and shortness of breath.   Cardiovascular:  Negative Barron chest pain, palpitations and leg swelling.  Gastrointestinal:  Negative Barron abdominal pain and blood in stool.  Neurological:  Negative Barron dizziness, syncope, light-headedness and headaches.     Objective:   Vitals:   10/10/24 1341  BP: 128/74  Pulse: 89  Resp: 16  Temp: 98.3 F (36.8 C)  TempSrc: Temporal  SpO2: 99%  Weight: 264 lb (119.7 kg)  Height: 5' 7 (1.702 m)     Physical Exam Vitals reviewed.  Constitutional:      Appearance: Normal appearance. She is well-developed.  HENT:     Head: Normocephalic and atraumatic.  Eyes:     Conjunctiva/sclera: Conjunctivae normal.     Pupils: Pupils are equal, round, and reactive to light.  Neck:     Vascular: No carotid bruit.  Cardiovascular:     Rate and Rhythm: Normal rate and regular rhythm.     Heart sounds: Normal heart sounds.  Pulmonary:      Effort: Pulmonary effort is normal.     Breath sounds: Normal breath sounds.  Abdominal:     Palpations: Abdomen is soft. There is no pulsatile mass.     Tenderness: There is no abdominal tenderness.  Musculoskeletal:     Right lower leg: No edema.     Left lower leg: No edema.     Comments: Right knee, flexion to approximately 90 degrees, full extension.  Tender over the lateral joint line.  Skin:    General: Skin is warm and dry.  Neurological:  Mental Status: She is alert and oriented to person, place, and time.  Psychiatric:        Mood and Affect: Mood normal.        Behavior: Behavior normal.        Assessment & Plan:  Kathy Barron is a 64 y.o. female . Essential hypertension - Plan: Comprehensive metabolic panel with GFR, lisinopril -hydrochlorothiazide  (ZESTORETIC ) 20-12.5 MG tablet, amLODipine  (NORVASC ) 10 MG tablet  Hyperlipidemia, unspecified hyperlipidemia type - Plan: Comprehensive metabolic panel with GFR, Lipid panel  Morbid obesity (HCC)  Right knee pain, unspecified chronicity - Plan: DG Knee Complete 4 Views Right   Meds ordered this encounter  Medications   lisinopril -hydrochlorothiazide  (ZESTORETIC ) 20-12.5 MG tablet    Sig: Take 1 tablet by mouth 2 (two) times daily.    Dispense:  180 tablet    Refill:  2   amLODipine  (NORVASC ) 10 MG tablet    Sig: TAKE 1 TABLET(10 MG) BY MOUTH DAILY    Dispense:  90 tablet    Refill:  2   Patient Instructions   Try Voltaren gel over-the-counter Barron right knee pain.  If you require that more than a week or 2, or any significant findings on your x-ray, I think meeting with orthopedics would be the next step.  Let me know.  Please have x-ray at the [facility] location below.  No change in medications at this time.  Please call gastroenterology to schedule colonoscopy.  Continue to watch diet and exercise, will try to help your knee pain to help with that plan.  Thank you Barron coming in today. [facility] or  xray: Walk in 8:30-4:30 during weekdays, no appointment needed [address]  [place], [place] [address]  [facility] [phone]    Signed,   Reyes Pines, MD  Primary Care, [facility] 10/10/24 2:39 PM      [1] No Known Allergies  "

## 2024-10-11 LAB — COMPREHENSIVE METABOLIC PANEL WITH GFR
ALT: 12 U/L (ref 3–35)
AST: 15 U/L (ref 5–37)
Albumin: 4.3 g/dL (ref 3.5–5.2)
Alkaline Phosphatase: 63 U/L (ref 39–117)
BUN: 8 mg/dL (ref 6–23)
CO2: 27 meq/L (ref 19–32)
Calcium: 9.6 mg/dL (ref 8.4–10.5)
Chloride: 102 meq/L (ref 96–112)
Creatinine, Ser: 0.74 mg/dL (ref 0.40–1.20)
GFR: 85.34 mL/min
Glucose, Bld: 73 mg/dL (ref 70–99)
Potassium: 4 meq/L (ref 3.5–5.1)
Sodium: 138 meq/L (ref 135–145)
Total Bilirubin: 0.9 mg/dL (ref 0.2–1.2)
Total Protein: 7.6 g/dL (ref 6.0–8.3)

## 2024-10-11 LAB — LIPID PANEL
Cholesterol: 179 mg/dL (ref 28–200)
HDL: 44.7 mg/dL
LDL Cholesterol: 120 mg/dL — ABNORMAL HIGH (ref 10–99)
NonHDL: 133.88
Total CHOL/HDL Ratio: 4
Triglycerides: 69 mg/dL (ref 10.0–149.0)
VLDL: 13.8 mg/dL (ref 0.0–40.0)

## 2024-10-15 ENCOUNTER — Ambulatory Visit: Payer: Self-pay | Admitting: Family Medicine

## 2024-10-17 NOTE — Telephone Encounter (Signed)
 Patient has appt scheduled in July. She is fine with rechecking in 3-6 months. Do you want to keep July appt as is?

## 2024-10-17 NOTE — Telephone Encounter (Signed)
 Yes, keep July appointment.  Thanks

## 2025-04-10 ENCOUNTER — Encounter: Admitting: Family Medicine
